# Patient Record
Sex: Male | Born: 1965 | Race: White | Hispanic: No | Marital: Married | Smoking: Former smoker
Health system: Southern US, Community
[De-identification: ages and names within clinical notes are randomized; demographics above are authoritative.]

## PROBLEM LIST (undated history)

## (undated) DIAGNOSIS — J45909 Unspecified asthma, uncomplicated: Secondary | ICD-10-CM

## (undated) DIAGNOSIS — I10 Essential (primary) hypertension: Secondary | ICD-10-CM

## (undated) DIAGNOSIS — G4733 Obstructive sleep apnea (adult) (pediatric): Secondary | ICD-10-CM

## (undated) DIAGNOSIS — F419 Anxiety disorder, unspecified: Secondary | ICD-10-CM

## (undated) DIAGNOSIS — J302 Other seasonal allergic rhinitis: Secondary | ICD-10-CM

## (undated) DIAGNOSIS — F32A Depression, unspecified: Secondary | ICD-10-CM

## (undated) HISTORY — PX: NISSEN FUNDOPLICATION: SHX2091

## (undated) HISTORY — DX: Unspecified asthma, uncomplicated: J45.909

## (undated) HISTORY — PX: APPENDECTOMY: SHX54

---

## 2015-01-07 DIAGNOSIS — I10 Essential (primary) hypertension: Secondary | ICD-10-CM | POA: Insufficient documentation

## 2015-01-07 DIAGNOSIS — R7301 Impaired fasting glucose: Secondary | ICD-10-CM | POA: Insufficient documentation

## 2015-01-07 DIAGNOSIS — K219 Gastro-esophageal reflux disease without esophagitis: Secondary | ICD-10-CM | POA: Insufficient documentation

## 2015-07-06 DIAGNOSIS — M4310 Spondylolisthesis, site unspecified: Secondary | ICD-10-CM | POA: Insufficient documentation

## 2015-11-25 DIAGNOSIS — M51369 Other intervertebral disc degeneration, lumbar region without mention of lumbar back pain or lower extremity pain: Secondary | ICD-10-CM | POA: Insufficient documentation

## 2015-11-25 DIAGNOSIS — M5136 Other intervertebral disc degeneration, lumbar region: Secondary | ICD-10-CM | POA: Insufficient documentation

## 2017-07-24 ENCOUNTER — Ambulatory Visit: Payer: Self-pay | Admitting: Allergy and Immunology

## 2017-08-21 ENCOUNTER — Encounter: Payer: Self-pay | Admitting: Allergy and Immunology

## 2017-08-21 ENCOUNTER — Ambulatory Visit: Payer: BLUE CROSS/BLUE SHIELD | Admitting: Allergy and Immunology

## 2017-08-21 VITALS — BP 112/74 | HR 72 | Temp 97.5°F | Resp 16 | Ht 70.25 in | Wt 207.0 lb

## 2017-08-21 DIAGNOSIS — T7840XD Allergy, unspecified, subsequent encounter: Secondary | ICD-10-CM | POA: Diagnosis not present

## 2017-08-21 DIAGNOSIS — Z91018 Allergy to other foods: Secondary | ICD-10-CM | POA: Insufficient documentation

## 2017-08-21 DIAGNOSIS — H1013 Acute atopic conjunctivitis, bilateral: Secondary | ICD-10-CM

## 2017-08-21 DIAGNOSIS — J3089 Other allergic rhinitis: Secondary | ICD-10-CM

## 2017-08-21 DIAGNOSIS — H1010 Acute atopic conjunctivitis, unspecified eye: Secondary | ICD-10-CM | POA: Insufficient documentation

## 2017-08-21 DIAGNOSIS — T7840XA Allergy, unspecified, initial encounter: Secondary | ICD-10-CM | POA: Insufficient documentation

## 2017-08-21 MED ORDER — CARBINOXAMINE MALEATE 4 MG PO TABS: 4.0000 mg | ORAL_TABLET | ORAL | 1 refills | Status: DC

## 2017-08-21 MED ORDER — MONTELUKAST SODIUM 10 MG PO TABS: 10.0000 mg | ORAL_TABLET | Freq: Every day | ORAL | 5 refills | Status: DC

## 2017-08-21 MED ORDER — FLUTICASONE PROPIONATE 93 MCG/ACT NA EXHU: 2.0000 | INHALANT_SUSPENSION | Freq: Two times a day (BID) | NASAL | 5 refills | Status: DC | PRN

## 2017-08-21 MED ORDER — OLOPATADINE HCL 0.2 % OP SOLN: 1.0000 [drp] | OPHTHALMIC | 5 refills | Status: DC

## 2017-08-21 NOTE — Progress Notes (Signed)
New Patient Note  RE: Timothy Murphy MRN: [id number] DOB: January 22, 1965 Date of Office Visit: 08/21/2017  Referring provider: Clayborn Heron, MD Primary care provider: No primary care provider on file.  Chief Complaint: Allergic Rhinitis ; Conjunctivitis; and Allergic Reaction   History of present illness: Timothy Murphy is a 52 y.o. male seen today in consultation requested by [place], MD.  Over the past 10 years, after having moved from [facility] [place] to [place], Timothy Murphy has experienced nasal congestion, rhinorrhea, thick postnasal drainage, sneezing, nasal pruritus, and sinus pressure.  These symptoms occur year around but are more frequent and severe during the springtime and fall.  The symptoms seem to have progressed over the years.  He has tried fluticasone nasal spray, cetirizine, loratadine, diphenhydramine, and "just about everything" without adequate symptom relief. Timothy Murphy also reports that over the past 15 years he has experienced episodes of severe abdominal cramping with abdominal pain that he describes as "lay down on the floor and hope to die" pain along with diarrhea, and occasionally associated with nausea and vomiting.  On one occasion he was found to have very low blood pressure.  He reports that these symptoms seem to follow the consumption of shellfish.  The onset of symptoms is typically a few hours after consuming shellfish, however there have been times that the symptoms have occurred the next day.  He eliminated shellfish from his diet approximately 8 years ago.  Since eliminating shellfish from his diet he has experienced abdominal pain and diarrhea on rare occasions, however the symptoms are much less severe than they had been previously, therefore he is uncertain if cross-contamination of shellfish protein is to blame.  Assessment and plan: Seasonal and perennial allergic rhinitis  Aeroallergen avoidance measures have been discussed and provided in  written form.  A prescription has been provided for carbinoxamine 4 mg every 6-8 hours if needed.  A prescription has been provided for montelukast 10 mg daily at bedtime.  A prescription has been provided for [facility], 2 actuations per nostril twice a day. Proper technique has been discussed and demonstrated.  Nasal saline spray (i.e., Simply Saline) or nasal saline lavage (i.e., NeilMed) is recommended as needed and prior to medicated nasal sprays.  The risks and benefits of aeroallergen immunotherapy have been discussed. The patient is motivated to initiate immunotherapy if insurance coverage is favorable. He will let us know how he would like to proceed.  Allergic conjunctivitis  Treatment plan as outlined above for allergic rhinitis.  A prescription has been provided for Pazeo, one drop per eye daily as needed.  I have also recommended eye lubricant drops (i.e., Natural Tears) as needed.  History of food allergy The patients history suggests the possibility of food allergy, though todays skin tests were negative despite a positive histamine control.  Food allergen skin testing has excellent negative predictive value however there is still a 5% chance that the allergy exists.  Therefore, we will investigate further with serum specific IgE levels and, if negative, open graded oral challenge.  A laboratory order form has been provided for baseline tryptase level and serum specific IgE against shellfish panel.  Until the food allergy has been definitively ruled out, the patient is to continue meticulous avoidance.   Meds ordered this encounter  Medications  . Carbinoxamine Maleate 4 MG TABS    Sig: Take 1 tablet (4 mg total) by mouth See admin instructions. every 4-6 hours as needed    Dispense:  180 each  Refill:  1  . montelukast (SINGULAIR) 10 MG tablet    Sig: Take 1 tablet (10 mg total) by mouth at bedtime.    Dispense:  30 tablet    Refill:  5  . Fluticasone Propionate  (XHANCE) 93 MCG/ACT EXHU    Sig: Place 2 sprays into both nostrils 2 (two) times daily as needed.    Dispense:  32 mL    Refill:  5    Patient has tried and failed fluticasone, azelastine, ipratropium.  Marland Kitchen. Olopatadine HCl (PATADAY) 0.2 % SOLN    Sig: Place 1 drop into both eyes 1 day or 1 dose.    Dispense:  1 Bottle    Refill:  5    Diagnostics: Epicutaneous testing: Positive to grass pollen, ragweed pollen, weed pollen, tree pollen, and molds. Intradermal testing: Positive to cat hair, dog epithelia, and dust mite antigen. Food allergen skin testing: Negative despite a positive histamine control.    Physical examination: Blood pressure 112/74, pulse 72, temperature (!) 97.5 F (36.4 C), temperature source Oral, resp. rate 16, height 5' 10.25" (1.784 m), weight 207 lb (93.9 kg).  General: Alert, interactive, in no acute distress. HEENT: TMs pearly gray, turbinates edematous with thick discharge, post-pharynx moderately erythematous. Neck: Supple without lymphadenopathy. Lungs: Clear to auscultation without wheezing, rhonchi or rales. CV: Normal S1, S2 without murmurs. Abdomen: Nondistended, nontender. Skin: Warm and dry, without lesions or rashes. Extremities:  No clubbing, cyanosis or edema. Neuro:   Grossly intact.  Review of systems:  Review of systems negative except as noted in HPI / PMHx or noted below: Review of Systems  Constitutional: Negative.   HENT: Negative.   Eyes: Negative.   Respiratory: Negative.   Cardiovascular: Negative.   Gastrointestinal: Negative.   Genitourinary: Negative.   Musculoskeletal: Negative.   Skin: Negative.   Neurological: Negative.   Endo/Heme/Allergies: Negative.   Psychiatric/Behavioral: Negative.     Past medical history:  Past Medical History:  Diagnosis Date  . Asthma    as a child    Past surgical history:  History reviewed. No pertinent surgical history.  Family history: Family History  Problem Relation Age of  Onset  . Allergic rhinitis Neg Hx   . Asthma Neg Hx   . Eczema Neg Hx   . Urticaria Neg Hx   . Angioedema Neg Hx     Social history: Social History   Socioeconomic History  . Marital status: Unknown    Spouse name: Not on file  . Number of children: Not on file  . Years of education: Not on file  . Highest education level: Not on file  Occupational History  . Not on file  Social Needs  . Financial resource strain: Not on file  . Food insecurity:    Worry: Not on file    Inability: Not on file  . Transportation needs:    Medical: Not on file    Non-medical: Not on file  Tobacco Use  . Smoking status: Former Smoker    Last attempt to quit: 2009    Years since quitting: 10.6  . Smokeless tobacco: Never Used  Substance and Sexual Activity  . Alcohol use: Yes    Comment: once a week seasonal  . Drug use: Never  . Sexual activity: Not on file  Lifestyle  . Physical activity:    Days per week: Not on file    Minutes per session: Not on file  . Stress: Not on file  Relationships  .  Social connections:    Talks on phone: Not on file    Gets together: Not on file    Attends religious service: Not on file    Active member of club or organization: Not on file    Attends meetings of clubs or organizations: Not on file    Relationship status: Not on file  . Intimate partner violence:    Fear of current or ex partner: Not on file    Emotionally abused: Not on file    Physically abused: Not on file    Forced sexual activity: Not on file  Other Topics Concern  . Not on file  Social History Narrative  . Not on file   Environmental History: The patient lives in a house built in [place] with hardwood floors throughout, gas heat, and central air.  There are 2 cats in the home which have access to his bedroom.  There is no known mold/water damage in the home.  He is a former cigarette smoker having started in 1986 and quit in 2009.  Allergies as of 08/21/2017      Reactions    Shellfish Allergy    Abdomen pain      Medication List        Accurate as of 08/21/17  6:08 PM. Always use your most recent med list.          amLODipine 5 MG tablet Commonly known as:  NORVASC Take 5 mg by mouth daily.   Carbinoxamine Maleate 4 MG Tabs Take 1 tablet (4 mg total) by mouth See admin instructions. every 4-6 hours as needed   diphenhydrAMINE 25 MG tablet Commonly known as:  BENADRYL Take 25 mg by mouth every 6 (six) hours as needed.   esomeprazole 20 MG capsule Commonly known as:  NEXIUM Take 20 mg by mouth as needed.   Fluticasone Propionate 93 MCG/ACT Exhu Commonly known as:  XHANCE Place 2 sprays into both nostrils 2 (two) times daily as needed.   lisinopril 10 MG tablet Commonly known as:  PRINIVIL,ZESTRIL Take 10 mg by mouth daily.   Melatonin 10 MG Caps Take by mouth.   montelukast 10 MG tablet Commonly known as:  SINGULAIR Take 1 tablet (10 mg total) by mouth at bedtime.   Olopatadine HCl 0.2 % Soln Commonly known as:  PATADAY Place 1 drop into both eyes 1 day or 1 dose.   Omeprazole 20 MG Tbec Take by mouth as needed.       Known medication allergies: Allergies  Allergen Reactions  . Shellfish Allergy     Abdomen pain     I appreciate the opportunity to take part in Timothy Murphy's care. Please do not hesitate to contact me with questions.  Sincerely,   R. Jorene Guest, MD

## 2017-08-21 NOTE — Assessment & Plan Note (Signed)
The patients history suggests the possibility of food allergy, though todays skin tests were negative despite a positive histamine control.  Food allergen skin testing has excellent negative predictive value however there is still a 5% chance that the allergy exists.  Therefore, we will investigate further with serum specific IgE levels and, if negative, open graded oral challenge.  A laboratory order form has been provided for baseline tryptase level and serum specific IgE against shellfish panel.  Until the food allergy has been definitively ruled out, the patient is to continue meticulous avoidance.

## 2017-08-21 NOTE — Patient Instructions (Addendum)
Seasonal and perennial allergic rhinitis  Aeroallergen avoidance measures have been discussed and provided in written form.  A prescription has been provided for carbinoxamine 4 mg every 6-8 hours if needed.  A prescription has been provided for montelukast 10 mg daily at bedtime.  A prescription has been provided for [facility], 2 actuations per nostril twice a day. Proper technique has been discussed and demonstrated.  Nasal saline spray (i.e., Simply Saline) or nasal saline lavage (i.e., NeilMed) is recommended as needed and prior to medicated nasal sprays.  The risks and benefits of aeroallergen immunotherapy have been discussed. The patient is motivated to initiate immunotherapy if insurance coverage is favorable. He will let us know how he would like to proceed.  Allergic conjunctivitis  Treatment plan as outlined above for allergic rhinitis.  A prescription has been provided for Pazeo, one drop per eye daily as needed.  I have also recommended eye lubricant drops (i.e., Natural Tears) as needed.  History of food allergy The patients history suggests the possibility of food allergy, though todays skin tests were negative despite a positive histamine control.  Food allergen skin testing has excellent negative predictive value however there is still a 5% chance that the allergy exists.  Therefore, we will investigate further with serum specific IgE levels and, if negative, open graded oral challenge.  A laboratory order form has been provided for baseline tryptase level and serum specific IgE against shellfish panel.  Until the food allergy has been definitively ruled out, the patient is to continue meticulous avoidance.   Return in about 3 months (around 11/21/2017), or if symptoms worsen or fail to improve.  Reducing Pollen Exposure  The American Academy of Allergy, Asthma and Immunology suggests the following steps to reduce your exposure to pollen during allergy seasons.     1. Do not hang sheets or clothing out to dry; pollen may collect on these items. 2. Do not mow lawns or spend time around freshly cut grass; mowing stirs up pollen. 3. Keep windows closed at night.  Keep car windows closed while driving. 4. Minimize morning activities outdoors, a time when pollen counts are usually at their highest. 5. Stay indoors as much as possible when pollen counts or humidity is high and on windy days when pollen tends to remain in the air longer. 6. Use air conditioning when possible.  Many air conditioners have filters that trap the pollen spores. 7. Use a HEPA room air filter to remove pollen form the indoor air you breathe.   Control of House Dust Mite Allergen  House dust mites play a major role in allergic asthma and rhinitis.  They occur in environments with high humidity wherever human skin, the food for dust mites is found. High levels have been detected in dust obtained from mattresses, pillows, carpets, upholstered furniture, bed covers, clothes and soft toys.  The principal allergen of the house dust mite is found in its feces.  A gram of dust may contain 1,000 mites and 250,000 fecal particles.  Mite antigen is easily measured in the air during house cleaning activities.    1. Encase mattresses, including the box spring, and pillow, in an air tight cover.  Seal the zipper end of the encased mattresses with wide adhesive tape. 2. Wash the bedding in water of 130 degrees Farenheit weekly.  Avoid cotton comforters/quilts and flannel bedding: the most ideal bed covering is the dacron comforter. 3. Remove all upholstered furniture from the bedroom. 4. Remove carpets, carpet padding,  rugs, and non-washable window drapes from the bedroom.  Wash drapes weekly or use plastic window coverings. 5. Remove all non-washable stuffed toys from the bedroom.  Wash stuffed toys weekly. 6. Have the room cleaned frequently with a vacuum cleaner and a damp dust-mop.  The patient  should not be in a room which is being cleaned and should wait 1 hour after cleaning before going into the room. 7. Close and seal all heating outlets in the bedroom.  Otherwise, the room will become filled with dust-laden air.  An electric heater can be used to heat the room. Reduce indoor humidity to less than 50%.  Do not use a humidifier.  Control of Dog or Cat Allergen  Avoidance is the best way to manage a dog or cat allergy. If you have a dog or cat and are allergic to dog or cats, consider removing the dog or cat from the home. If you have a dog or cat but don't want to find it a new home, or if your family wants a pet even though someone in the household is allergic, here are some strategies that may help keep symptoms at bay:  1. Keep the pet out of your bedroom and restrict it to only a few rooms. Be advised that keeping the dog or cat in only one room will not limit the allergens to that room. 2. Don't pet, hug or kiss the dog or cat; if you do, wash your hands with soap and water. 3. High-efficiency particulate air (HEPA) cleaners run continuously in a bedroom or living room can reduce allergen levels over time. 4. Place electrostatic material sheet in the air inlet vent in the bedroom. 5. Regular use of a high-efficiency vacuum cleaner or a central vacuum can reduce allergen levels. 6. Giving your dog or cat a bath at least once a week can reduce airborne allergen.  Control of Mold Allergen  Mold and fungi can grow on a variety of surfaces provided certain temperature and moisture conditions exist.  Outdoor molds grow on plants, decaying vegetation and soil.  The major outdoor mold, Alternaria and Cladosporium, are found in very high numbers during hot and dry conditions.  Generally, a late Summer - Fall peak is seen for common outdoor fungal spores.  Rain will temporarily lower outdoor mold spore count, but counts rise rapidly when the rainy period ends.  The most important indoor  molds are Aspergillus and Penicillium.  Dark, humid and poorly ventilated basements are ideal sites for mold growth.  The next most common sites of mold growth are the bathroom and the kitchen.  Outdoor [employer] 1. Use air conditioning and keep windows closed 2. Avoid exposure to decaying vegetation. 3. Avoid leaf raking. 4. Avoid grain handling. 5. Consider wearing a face mask if working in moldy areas.  Indoor Mold Control 1. Maintain humidity below 50%. 2. Clean washable surfaces with 5% bleach solution. 3. Remove sources e.g. Contaminated carpets.  Control of Cockroach Allergen  Cockroach allergen has been identified as an important cause of acute attacks of asthma, especially in urban settings.  There are fifty-five species of cockroach that exist in the [place], however only three, the [place], [place] species produce allergen that can affect patients with Asthma.  Allergens can be obtained from fecal particles, egg casings and secretions from cockroaches.    1. Remove food sources. 2. Reduce access to water. 3. Seal access and entry points. 4. Spray runways with 0.5-1% Diazinon or  Chlorpyrifos 5. Blow boric acid power under stoves and refrigerator. 6. Place bait stations (hydramethylnon) at feeding sites.

## 2017-08-21 NOTE — Assessment & Plan Note (Signed)
   Aeroallergen avoidance measures have been discussed and provided in written form.  A prescription has been provided for carbinoxamine 4 mg every 6-8 hours if needed.  A prescription has been provided for montelukast 10 mg daily at bedtime.  [facility] 2 actuations per nostril twice a day. Proper technique has been discussed and demonstrated.  Nasal saline spray (i.e., Simply Saline) or nasal saline lavage (i.e., NeilMed) is recommended as needed and prior to medicated nasal sprays.  The risks and benefits of aeroallergen immunotherapy have been discussed. The patient is motivated to initiate immunotherapy if insurance coverage is favorable. He will let us know how he would like to proceed.

## 2017-08-21 NOTE — Assessment & Plan Note (Signed)
   Treatment plan as outlined above for allergic rhinitis.  A prescription has been provided for Pazeo, one drop per eye daily as needed.  I have also recommended eye lubricant drops (i.e., Natural Tears) as needed. 

## 2017-08-22 NOTE — Progress Notes (Signed)
VIALS EXP 08-24-18 

## 2017-08-25 LAB — ALPHA-GAL PANEL
Alpha Gal IgE*: <0.1 kU/L (ref <0.10)
Beef (Bos spp) IgE: <0.1 kU/L (ref <0.35)
Class Interpretation: 0
Class Interpretation: 0
Class Interpretation: 0
Lamb/Mutton (Ovis spp) IgE: <0.1 kU/L (ref <0.35)
Pork (Sus spp) IgE: <0.1 kU/L (ref <0.35)

## 2017-08-25 LAB — TRYPTASE: Tryptase: 3.4 ug/L (ref 2.2–13.2)

## 2017-08-25 LAB — ALLERGEN PROFILE, SHELLFISH
Clam IgE: <0.1 kU/L
F023-IgE Crab: <0.1 kU/L
F080-IgE Lobster: <0.1 kU/L
F290-IgE Oyster: <0.1 kU/L
Scallop IgE: <0.1 kU/L
Shrimp IgE: <0.1 kU/L

## 2017-08-29 DIAGNOSIS — J301 Allergic rhinitis due to pollen: Secondary | ICD-10-CM

## 2017-08-30 DIAGNOSIS — J3089 Other allergic rhinitis: Secondary | ICD-10-CM | POA: Diagnosis not present

## 2017-09-24 ENCOUNTER — Ambulatory Visit (INDEPENDENT_AMBULATORY_CARE_PROVIDER_SITE_OTHER): Payer: BLUE CROSS/BLUE SHIELD | Admitting: *Deleted

## 2017-09-24 DIAGNOSIS — J309 Allergic rhinitis, unspecified: Secondary | ICD-10-CM

## 2017-09-24 DIAGNOSIS — J3089 Other allergic rhinitis: Secondary | ICD-10-CM

## 2017-09-24 MED ORDER — EPINEPHRINE 0.3 MG/0.3ML IJ SOAJ: INTRAMUSCULAR | 1 refills | Status: DC

## 2017-09-24 NOTE — Progress Notes (Signed)
Immunotherapy   Patient Details  Name: Timothy Murphy MRN: [id number] Date of Birth: 09-09-1965  09/24/2017  Roosvelt Harps started injections for  [id number]-[id number] Following schedule: A  Frequency:2 times per week Epi-Pen:Prescription for Epi-Pen given Consent signed and patient instructions given.   Bennye Alm 09/24/2017, 3:33 PM

## 2017-09-28 ENCOUNTER — Ambulatory Visit (INDEPENDENT_AMBULATORY_CARE_PROVIDER_SITE_OTHER): Payer: BLUE CROSS/BLUE SHIELD

## 2017-09-28 DIAGNOSIS — J309 Allergic rhinitis, unspecified: Secondary | ICD-10-CM

## 2017-10-04 ENCOUNTER — Ambulatory Visit (INDEPENDENT_AMBULATORY_CARE_PROVIDER_SITE_OTHER): Payer: BLUE CROSS/BLUE SHIELD | Admitting: *Deleted

## 2017-10-04 DIAGNOSIS — J309 Allergic rhinitis, unspecified: Secondary | ICD-10-CM | POA: Diagnosis not present

## 2017-10-10 ENCOUNTER — Ambulatory Visit (INDEPENDENT_AMBULATORY_CARE_PROVIDER_SITE_OTHER): Payer: BLUE CROSS/BLUE SHIELD

## 2017-10-10 DIAGNOSIS — J309 Allergic rhinitis, unspecified: Secondary | ICD-10-CM | POA: Diagnosis not present

## 2017-10-16 ENCOUNTER — Ambulatory Visit (INDEPENDENT_AMBULATORY_CARE_PROVIDER_SITE_OTHER): Payer: BLUE CROSS/BLUE SHIELD | Admitting: *Deleted

## 2017-10-16 DIAGNOSIS — J309 Allergic rhinitis, unspecified: Secondary | ICD-10-CM | POA: Diagnosis not present

## 2017-10-23 ENCOUNTER — Ambulatory Visit (INDEPENDENT_AMBULATORY_CARE_PROVIDER_SITE_OTHER): Payer: BLUE CROSS/BLUE SHIELD | Admitting: *Deleted

## 2017-10-23 DIAGNOSIS — J309 Allergic rhinitis, unspecified: Secondary | ICD-10-CM | POA: Diagnosis not present

## 2017-10-29 ENCOUNTER — Ambulatory Visit (INDEPENDENT_AMBULATORY_CARE_PROVIDER_SITE_OTHER): Payer: BLUE CROSS/BLUE SHIELD | Admitting: *Deleted

## 2017-10-29 DIAGNOSIS — J309 Allergic rhinitis, unspecified: Secondary | ICD-10-CM | POA: Diagnosis not present

## 2017-11-07 ENCOUNTER — Ambulatory Visit (INDEPENDENT_AMBULATORY_CARE_PROVIDER_SITE_OTHER): Payer: BLUE CROSS/BLUE SHIELD

## 2017-11-07 DIAGNOSIS — J309 Allergic rhinitis, unspecified: Secondary | ICD-10-CM | POA: Diagnosis not present

## 2017-11-14 ENCOUNTER — Ambulatory Visit (INDEPENDENT_AMBULATORY_CARE_PROVIDER_SITE_OTHER): Payer: BLUE CROSS/BLUE SHIELD | Admitting: *Deleted

## 2017-11-14 DIAGNOSIS — J309 Allergic rhinitis, unspecified: Secondary | ICD-10-CM

## 2017-11-19 ENCOUNTER — Encounter: Payer: Self-pay | Admitting: Allergy and Immunology

## 2017-11-19 ENCOUNTER — Ambulatory Visit: Payer: BLUE CROSS/BLUE SHIELD | Admitting: Allergy and Immunology

## 2017-11-19 ENCOUNTER — Ambulatory Visit: Payer: Self-pay

## 2017-11-19 VITALS — BP 110/80 | HR 92 | Resp 16

## 2017-11-19 DIAGNOSIS — H1013 Acute atopic conjunctivitis, bilateral: Secondary | ICD-10-CM

## 2017-11-19 DIAGNOSIS — J3089 Other allergic rhinitis: Secondary | ICD-10-CM

## 2017-11-19 DIAGNOSIS — J309 Allergic rhinitis, unspecified: Secondary | ICD-10-CM

## 2017-11-19 NOTE — Progress Notes (Signed)
Follow-up Note  RE: Timothy Murphy MRN: [id number] DOB: 1965-10-29 Date of Office Visit: 11/19/2017  Primary care provider: Clayborn Heron, MD Referring provider: No ref. provider found  History of present illness: Timothy Murphy is a 52 y.o. male with allergic rhinoconjunctivitis on immunotherapy presenting today for follow-up.  He was previously seen in this clinic for his initial evaluation on August 21, 2017. He reports that he is nasal and ocular allergy symptoms have improved while on the immunotherapy.  He states that he is no longer snoring at night and exposure to his cats no longer provokes symptoms.  He does note that he has experienced some increased nasal congestion with the recent weather change.  He is tolerating aeroallergen immunotherapy buildup injections without problems or complications.  He is taking carbinoxamine occasionally during the day and takes montelukast at bedtime.  He reports that the nasal spray as prescribed have provided no benefit so he has discontinued using these and notes that he has tried multiple nasal sprays in the past without benefit.  Assessment and plan: Seasonal and perennial allergic rhinitis  Continue appropriate aeroallergen avoidance measures, aeroallergen immunotherapy injections as prescribed and as tolerated, carbinoxamine 4 mg every 8 hours if needed, and montelukast 10 mg daily at bedtime.  For thick post nasal drainage, nasal congestion, and/or sinus pressure, add guaifenesin [phone] mg (Mucinex) plus/minus pseudoephedrine 60-120 mg  twice daily as needed with adequate hydration as discussed. Pseudoephedrine is only to be used for short-term relief of nasal/sinus congestion. Long-term use is discouraged due to potential side effects.   Physical examination: Blood pressure 110/80, pulse 92, resp. rate 16.  General: Alert, interactive, in no acute distress. HEENT: TMs pearly gray, turbinates mildly edematous without discharge,  post-pharynx mildly erythematous. Neck: Supple without lymphadenopathy. Lungs: Clear to auscultation without wheezing, rhonchi or rales. CV: Normal S1, S2 without murmurs. Skin: Warm and dry, without lesions or rashes.  The following portions of the patient's history were reviewed and updated as appropriate: allergies, current medications, past family history, past medical history, past social history, past surgical history and problem list.  Allergies as of 11/19/2017      Reactions   Shellfish Allergy    Abdomen pain      Medication List        Accurate as of 11/19/17 11:16 AM. Always use your most recent med list.          amLODipine 5 MG tablet Commonly known as:  NORVASC Take 5 mg by mouth daily.   Carbinoxamine Maleate 4 MG Tabs Take 1 tablet (4 mg total) by mouth See admin instructions. every 4-6 hours as needed   diphenhydrAMINE 25 MG tablet Commonly known as:  BENADRYL Take 25 mg by mouth every 6 (six) hours as needed.   EPINEPHrine 0.3 mg/0.3 mL Soaj injection Commonly known as:  EPI-PEN Use as directed for severe allergic reaction   Fluticasone Propionate 93 MCG/ACT Exhu Place 2 sprays into both nostrils 2 (two) times daily as needed.   lisinopril 10 MG tablet Commonly known as:  PRINIVIL,ZESTRIL Take 10 mg by mouth daily.   Melatonin 10 MG Caps Take by mouth.   montelukast 10 MG tablet Commonly known as:  SINGULAIR Take 1 tablet (10 mg total) by mouth at bedtime.   Olopatadine HCl 0.2 % Soln Place 1 drop into both eyes 1 day or 1 dose.   omeprazole 10 MG capsule Commonly known as:  PRILOSEC Take 10 mg by mouth daily.  Allergies  Allergen Reactions  . Shellfish Allergy     Abdomen pain     I appreciate the opportunity to take part in Timothy Murphy's care. Please do not hesitate to contact me with questions.  Sincerely,   R. Jorene Guest, MD

## 2017-11-19 NOTE — Assessment & Plan Note (Signed)
   Continue appropriate aeroallergen avoidance measures, aeroallergen immunotherapy injections as prescribed and as tolerated, carbinoxamine 4 mg every 8 hours if needed, and montelukast 10 mg daily at bedtime.  For thick post nasal drainage, nasal congestion, and/or sinus pressure, add guaifenesin [phone] mg (Mucinex) plus/minus pseudoephedrine 60-120 mg  twice daily as needed with adequate hydration as discussed. Pseudoephedrine is only to be used for short-term relief of nasal/sinus congestion. Long-term use is discouraged due to potential side effects.

## 2017-11-19 NOTE — Patient Instructions (Addendum)
Seasonal and perennial allergic rhinitis  Continue appropriate aeroallergen avoidance measures, aeroallergen immunotherapy injections as prescribed and as tolerated, carbinoxamine 4 mg every 8 hours if needed, and montelukast 10 mg daily at bedtime.  For thick post nasal drainage, nasal congestion, and/or sinus pressure, add guaifenesin [phone] mg (Mucinex) plus/minus pseudoephedrine 60-120 mg  twice daily as needed with adequate hydration as discussed. Pseudoephedrine is only to be used for short-term relief of nasal/sinus congestion. Long-term use is discouraged due to potential side effects.   Return in about 1 year (around 11/20/2018), or if symptoms worsen or fail to improve.

## 2017-11-26 ENCOUNTER — Ambulatory Visit (INDEPENDENT_AMBULATORY_CARE_PROVIDER_SITE_OTHER): Payer: BLUE CROSS/BLUE SHIELD | Admitting: *Deleted

## 2017-11-26 DIAGNOSIS — J309 Allergic rhinitis, unspecified: Secondary | ICD-10-CM | POA: Diagnosis not present

## 2017-11-27 ENCOUNTER — Ambulatory Visit: Payer: BLUE CROSS/BLUE SHIELD | Admitting: Allergy and Immunology

## 2017-12-07 ENCOUNTER — Ambulatory Visit (INDEPENDENT_AMBULATORY_CARE_PROVIDER_SITE_OTHER): Payer: BLUE CROSS/BLUE SHIELD

## 2017-12-07 DIAGNOSIS — J309 Allergic rhinitis, unspecified: Secondary | ICD-10-CM

## 2017-12-18 ENCOUNTER — Ambulatory Visit (INDEPENDENT_AMBULATORY_CARE_PROVIDER_SITE_OTHER): Payer: BLUE CROSS/BLUE SHIELD | Admitting: *Deleted

## 2017-12-18 DIAGNOSIS — J309 Allergic rhinitis, unspecified: Secondary | ICD-10-CM | POA: Diagnosis not present

## 2017-12-25 ENCOUNTER — Ambulatory Visit (INDEPENDENT_AMBULATORY_CARE_PROVIDER_SITE_OTHER): Payer: BLUE CROSS/BLUE SHIELD | Admitting: *Deleted

## 2017-12-25 DIAGNOSIS — J309 Allergic rhinitis, unspecified: Secondary | ICD-10-CM

## 2018-01-14 ENCOUNTER — Ambulatory Visit (INDEPENDENT_AMBULATORY_CARE_PROVIDER_SITE_OTHER): Payer: BLUE CROSS/BLUE SHIELD | Admitting: *Deleted

## 2018-01-14 DIAGNOSIS — J309 Allergic rhinitis, unspecified: Secondary | ICD-10-CM | POA: Diagnosis not present

## 2018-01-21 ENCOUNTER — Ambulatory Visit (INDEPENDENT_AMBULATORY_CARE_PROVIDER_SITE_OTHER): Payer: BLUE CROSS/BLUE SHIELD

## 2018-01-21 DIAGNOSIS — J309 Allergic rhinitis, unspecified: Secondary | ICD-10-CM

## 2018-01-28 ENCOUNTER — Ambulatory Visit (INDEPENDENT_AMBULATORY_CARE_PROVIDER_SITE_OTHER): Payer: BLUE CROSS/BLUE SHIELD

## 2018-01-28 DIAGNOSIS — J309 Allergic rhinitis, unspecified: Secondary | ICD-10-CM

## 2018-02-05 ENCOUNTER — Ambulatory Visit (INDEPENDENT_AMBULATORY_CARE_PROVIDER_SITE_OTHER): Payer: BLUE CROSS/BLUE SHIELD | Admitting: *Deleted

## 2018-02-05 DIAGNOSIS — J309 Allergic rhinitis, unspecified: Secondary | ICD-10-CM

## 2018-02-10 ENCOUNTER — Other Ambulatory Visit: Payer: Self-pay | Admitting: Allergy and Immunology

## 2018-02-14 ENCOUNTER — Ambulatory Visit (INDEPENDENT_AMBULATORY_CARE_PROVIDER_SITE_OTHER): Payer: BLUE CROSS/BLUE SHIELD | Admitting: *Deleted

## 2018-02-14 DIAGNOSIS — J309 Allergic rhinitis, unspecified: Secondary | ICD-10-CM

## 2018-02-20 ENCOUNTER — Ambulatory Visit (INDEPENDENT_AMBULATORY_CARE_PROVIDER_SITE_OTHER): Payer: BLUE CROSS/BLUE SHIELD

## 2018-02-20 DIAGNOSIS — J309 Allergic rhinitis, unspecified: Secondary | ICD-10-CM | POA: Diagnosis not present

## 2018-02-26 ENCOUNTER — Ambulatory Visit (INDEPENDENT_AMBULATORY_CARE_PROVIDER_SITE_OTHER): Payer: BLUE CROSS/BLUE SHIELD | Admitting: *Deleted

## 2018-02-26 DIAGNOSIS — J309 Allergic rhinitis, unspecified: Secondary | ICD-10-CM | POA: Diagnosis not present

## 2018-03-05 ENCOUNTER — Ambulatory Visit (INDEPENDENT_AMBULATORY_CARE_PROVIDER_SITE_OTHER): Payer: BLUE CROSS/BLUE SHIELD | Admitting: *Deleted

## 2018-03-05 DIAGNOSIS — J309 Allergic rhinitis, unspecified: Secondary | ICD-10-CM | POA: Diagnosis not present

## 2018-03-25 ENCOUNTER — Ambulatory Visit (INDEPENDENT_AMBULATORY_CARE_PROVIDER_SITE_OTHER): Payer: BLUE CROSS/BLUE SHIELD

## 2018-03-25 DIAGNOSIS — J309 Allergic rhinitis, unspecified: Secondary | ICD-10-CM | POA: Diagnosis not present

## 2018-03-27 ENCOUNTER — Ambulatory Visit (INDEPENDENT_AMBULATORY_CARE_PROVIDER_SITE_OTHER): Payer: BLUE CROSS/BLUE SHIELD | Admitting: *Deleted

## 2018-03-27 DIAGNOSIS — J309 Allergic rhinitis, unspecified: Secondary | ICD-10-CM | POA: Diagnosis not present

## 2018-04-02 ENCOUNTER — Other Ambulatory Visit: Payer: Self-pay

## 2018-04-02 ENCOUNTER — Ambulatory Visit (INDEPENDENT_AMBULATORY_CARE_PROVIDER_SITE_OTHER): Payer: BLUE CROSS/BLUE SHIELD | Admitting: *Deleted

## 2018-04-02 ENCOUNTER — Other Ambulatory Visit: Payer: Self-pay | Admitting: Allergy and Immunology

## 2018-04-02 DIAGNOSIS — J309 Allergic rhinitis, unspecified: Secondary | ICD-10-CM

## 2018-04-02 MED ORDER — CARBINOXAMINE MALEATE 4 MG PO TABS: 4.0000 mg | ORAL_TABLET | ORAL | 5 refills | Status: DC

## 2018-04-02 NOTE — Telephone Encounter (Signed)
Patient needs refill on carbinoxamine called into [facility] on [address]

## 2018-04-02 NOTE — Telephone Encounter (Signed)
Refill sent.

## 2018-04-05 ENCOUNTER — Ambulatory Visit (INDEPENDENT_AMBULATORY_CARE_PROVIDER_SITE_OTHER): Payer: BLUE CROSS/BLUE SHIELD | Admitting: *Deleted

## 2018-04-05 DIAGNOSIS — J309 Allergic rhinitis, unspecified: Secondary | ICD-10-CM | POA: Diagnosis not present

## 2018-04-12 ENCOUNTER — Ambulatory Visit (INDEPENDENT_AMBULATORY_CARE_PROVIDER_SITE_OTHER): Payer: BLUE CROSS/BLUE SHIELD | Admitting: *Deleted

## 2018-04-12 DIAGNOSIS — J309 Allergic rhinitis, unspecified: Secondary | ICD-10-CM | POA: Diagnosis not present

## 2018-04-16 ENCOUNTER — Ambulatory Visit (INDEPENDENT_AMBULATORY_CARE_PROVIDER_SITE_OTHER): Payer: BLUE CROSS/BLUE SHIELD | Admitting: *Deleted

## 2018-04-16 DIAGNOSIS — J309 Allergic rhinitis, unspecified: Secondary | ICD-10-CM | POA: Diagnosis not present

## 2018-04-25 ENCOUNTER — Ambulatory Visit (INDEPENDENT_AMBULATORY_CARE_PROVIDER_SITE_OTHER): Payer: BLUE CROSS/BLUE SHIELD

## 2018-04-25 DIAGNOSIS — J309 Allergic rhinitis, unspecified: Secondary | ICD-10-CM | POA: Diagnosis not present

## 2018-05-01 ENCOUNTER — Ambulatory Visit (INDEPENDENT_AMBULATORY_CARE_PROVIDER_SITE_OTHER): Payer: BLUE CROSS/BLUE SHIELD

## 2018-05-01 DIAGNOSIS — J309 Allergic rhinitis, unspecified: Secondary | ICD-10-CM

## 2018-05-07 ENCOUNTER — Ambulatory Visit (INDEPENDENT_AMBULATORY_CARE_PROVIDER_SITE_OTHER): Payer: BLUE CROSS/BLUE SHIELD | Admitting: *Deleted

## 2018-05-07 DIAGNOSIS — J309 Allergic rhinitis, unspecified: Secondary | ICD-10-CM | POA: Diagnosis not present

## 2018-05-20 ENCOUNTER — Ambulatory Visit (INDEPENDENT_AMBULATORY_CARE_PROVIDER_SITE_OTHER): Payer: BLUE CROSS/BLUE SHIELD

## 2018-05-20 DIAGNOSIS — J309 Allergic rhinitis, unspecified: Secondary | ICD-10-CM

## 2018-05-30 ENCOUNTER — Ambulatory Visit (INDEPENDENT_AMBULATORY_CARE_PROVIDER_SITE_OTHER): Payer: BLUE CROSS/BLUE SHIELD

## 2018-05-30 DIAGNOSIS — J309 Allergic rhinitis, unspecified: Secondary | ICD-10-CM

## 2018-06-05 ENCOUNTER — Ambulatory Visit (INDEPENDENT_AMBULATORY_CARE_PROVIDER_SITE_OTHER): Payer: BLUE CROSS/BLUE SHIELD | Admitting: *Deleted

## 2018-06-05 DIAGNOSIS — J309 Allergic rhinitis, unspecified: Secondary | ICD-10-CM

## 2018-06-18 ENCOUNTER — Ambulatory Visit (INDEPENDENT_AMBULATORY_CARE_PROVIDER_SITE_OTHER): Payer: BLUE CROSS/BLUE SHIELD | Admitting: *Deleted

## 2018-06-18 DIAGNOSIS — J309 Allergic rhinitis, unspecified: Secondary | ICD-10-CM | POA: Diagnosis not present

## 2018-07-01 ENCOUNTER — Ambulatory Visit (INDEPENDENT_AMBULATORY_CARE_PROVIDER_SITE_OTHER): Payer: BLUE CROSS/BLUE SHIELD | Admitting: *Deleted

## 2018-07-01 DIAGNOSIS — J309 Allergic rhinitis, unspecified: Secondary | ICD-10-CM

## 2018-07-09 ENCOUNTER — Ambulatory Visit (INDEPENDENT_AMBULATORY_CARE_PROVIDER_SITE_OTHER): Payer: BLUE CROSS/BLUE SHIELD | Admitting: *Deleted

## 2018-07-09 DIAGNOSIS — J309 Allergic rhinitis, unspecified: Secondary | ICD-10-CM | POA: Diagnosis not present

## 2018-07-16 ENCOUNTER — Ambulatory Visit (INDEPENDENT_AMBULATORY_CARE_PROVIDER_SITE_OTHER): Payer: BLUE CROSS/BLUE SHIELD | Admitting: *Deleted

## 2018-07-16 DIAGNOSIS — J309 Allergic rhinitis, unspecified: Secondary | ICD-10-CM

## 2018-07-23 ENCOUNTER — Ambulatory Visit (INDEPENDENT_AMBULATORY_CARE_PROVIDER_SITE_OTHER): Payer: BLUE CROSS/BLUE SHIELD | Admitting: *Deleted

## 2018-07-23 DIAGNOSIS — J309 Allergic rhinitis, unspecified: Secondary | ICD-10-CM | POA: Diagnosis not present

## 2018-07-24 DIAGNOSIS — J301 Allergic rhinitis due to pollen: Secondary | ICD-10-CM | POA: Diagnosis not present

## 2018-07-24 NOTE — Progress Notes (Signed)
VIALS EXP 07-24-2019 

## 2018-08-01 ENCOUNTER — Ambulatory Visit (INDEPENDENT_AMBULATORY_CARE_PROVIDER_SITE_OTHER): Payer: BC Managed Care – PPO | Admitting: *Deleted

## 2018-08-01 DIAGNOSIS — J309 Allergic rhinitis, unspecified: Secondary | ICD-10-CM

## 2018-08-05 ENCOUNTER — Ambulatory Visit (INDEPENDENT_AMBULATORY_CARE_PROVIDER_SITE_OTHER): Payer: BC Managed Care – PPO | Admitting: *Deleted

## 2018-08-05 DIAGNOSIS — J309 Allergic rhinitis, unspecified: Secondary | ICD-10-CM | POA: Diagnosis not present

## 2018-08-12 ENCOUNTER — Other Ambulatory Visit: Payer: Self-pay | Admitting: Allergy and Immunology

## 2018-08-15 ENCOUNTER — Ambulatory Visit (INDEPENDENT_AMBULATORY_CARE_PROVIDER_SITE_OTHER): Payer: BC Managed Care – PPO

## 2018-08-15 DIAGNOSIS — J309 Allergic rhinitis, unspecified: Secondary | ICD-10-CM

## 2018-08-21 ENCOUNTER — Ambulatory Visit (INDEPENDENT_AMBULATORY_CARE_PROVIDER_SITE_OTHER): Payer: BC Managed Care – PPO | Admitting: *Deleted

## 2018-08-21 DIAGNOSIS — J309 Allergic rhinitis, unspecified: Secondary | ICD-10-CM | POA: Diagnosis not present

## 2018-08-30 ENCOUNTER — Ambulatory Visit (INDEPENDENT_AMBULATORY_CARE_PROVIDER_SITE_OTHER): Payer: BC Managed Care – PPO | Admitting: *Deleted

## 2018-08-30 DIAGNOSIS — J309 Allergic rhinitis, unspecified: Secondary | ICD-10-CM

## 2018-09-06 ENCOUNTER — Ambulatory Visit (INDEPENDENT_AMBULATORY_CARE_PROVIDER_SITE_OTHER): Payer: BC Managed Care – PPO

## 2018-09-06 DIAGNOSIS — J309 Allergic rhinitis, unspecified: Secondary | ICD-10-CM

## 2018-09-09 ENCOUNTER — Ambulatory Visit (INDEPENDENT_AMBULATORY_CARE_PROVIDER_SITE_OTHER): Payer: BC Managed Care – PPO | Admitting: *Deleted

## 2018-09-09 DIAGNOSIS — J309 Allergic rhinitis, unspecified: Secondary | ICD-10-CM

## 2018-09-19 ENCOUNTER — Ambulatory Visit: Payer: BC Managed Care – PPO | Admitting: Podiatry

## 2018-09-19 ENCOUNTER — Ambulatory Visit (INDEPENDENT_AMBULATORY_CARE_PROVIDER_SITE_OTHER): Payer: BC Managed Care – PPO

## 2018-09-19 ENCOUNTER — Other Ambulatory Visit: Payer: Self-pay

## 2018-09-19 ENCOUNTER — Encounter: Payer: Self-pay | Admitting: Podiatry

## 2018-09-19 VITALS — BP 121/85 | HR 80 | Resp 16

## 2018-09-19 DIAGNOSIS — M722 Plantar fascial fibromatosis: Secondary | ICD-10-CM

## 2018-09-19 MED ORDER — DICLOFENAC SODIUM 75 MG PO TBEC: 75.0000 mg | DELAYED_RELEASE_TABLET | Freq: Two times a day (BID) | ORAL | 2 refills | Status: DC

## 2018-09-19 NOTE — Patient Instructions (Signed)

## 2018-09-19 NOTE — Progress Notes (Signed)
Subjective:   Patient ID: Timothy Murphy, male   DOB: 53 y.o.   MRN: [id number]   HPI Patient presents stating he is developed intense discomfort in the right plantar heel that has been going on for around 2 months and has had a history of back pain is not sure this may not be contributory.  Patient does not smoke likes to be active   Review of Systems  All other systems reviewed and are negative.       Objective:  Physical Exam Vitals signs and nursing note reviewed.  Constitutional:      Appearance: He is well-developed.  Pulmonary:     Effort: Pulmonary effort is normal.  Musculoskeletal: Normal range of motion.  Skin:    General: Skin is warm.  Neurological:     Mental Status: He is alert.     Neurovascular status intact muscle strength adequate range of motion within normal limits with patient found to have exquisite discomfort plantar aspect right heel at the insertional point of the tendon into the calcaneus with inflammation fluid around the medial band.  Patient has moderate depression of the arch and is noted to have good digital perfusion and is well oriented x3     Assessment:  Acute plantar fasciitis right with inflammation fluid medial band     Plan:  H&P condition reviewed and today I did sterile prep and injected the fascia at its insertion 3 mg Kenalog 5 mg Xylocaine and apply fascial brace with instructions and gave prescription for diclofenac 75 mg twice daily and exercises to strengthen the fascia along with shoe gear modifications  X-rays indicate that there is small spur no indications of stress fracture or arthritis

## 2018-09-19 NOTE — Progress Notes (Signed)
   Subjective:    Patient ID: Timothy Murphy, male    DOB: 1965/01/24, 53 y.o.   MRN: [id number]  HPI    Review of Systems  All other systems reviewed and are negative.      Objective:   Physical Exam        Assessment & Plan:

## 2018-09-20 ENCOUNTER — Ambulatory Visit (INDEPENDENT_AMBULATORY_CARE_PROVIDER_SITE_OTHER): Payer: BC Managed Care – PPO | Admitting: *Deleted

## 2018-09-20 DIAGNOSIS — J309 Allergic rhinitis, unspecified: Secondary | ICD-10-CM

## 2018-10-01 ENCOUNTER — Ambulatory Visit (INDEPENDENT_AMBULATORY_CARE_PROVIDER_SITE_OTHER): Payer: BC Managed Care – PPO | Admitting: *Deleted

## 2018-10-01 DIAGNOSIS — J309 Allergic rhinitis, unspecified: Secondary | ICD-10-CM | POA: Diagnosis not present

## 2018-10-03 ENCOUNTER — Ambulatory Visit (INDEPENDENT_AMBULATORY_CARE_PROVIDER_SITE_OTHER): Payer: BC Managed Care – PPO | Admitting: Podiatry

## 2018-10-03 ENCOUNTER — Encounter: Payer: Self-pay | Admitting: Podiatry

## 2018-10-03 ENCOUNTER — Other Ambulatory Visit: Payer: Self-pay

## 2018-10-03 DIAGNOSIS — M722 Plantar fascial fibromatosis: Secondary | ICD-10-CM

## 2018-10-07 ENCOUNTER — Ambulatory Visit (INDEPENDENT_AMBULATORY_CARE_PROVIDER_SITE_OTHER): Payer: BC Managed Care – PPO

## 2018-10-07 DIAGNOSIS — J309 Allergic rhinitis, unspecified: Secondary | ICD-10-CM

## 2018-10-07 NOTE — Progress Notes (Signed)
Subjective:   Patient ID: Timothy Murphy, male   DOB: 53 y.o.   MRN: [id number]   HPI Patient presents stating that the tendon is still bothering him to a degree but improved but worse when he is on cement surfaces   ROS      Objective:  Physical Exam  Neurovascular status intact with patient found to have continued discomfort in the right fascia at the insertional point tendon into the calcaneus with inflammation fluid buildup     Assessment:  Acute plantar fasciitis right with inflammation fluid buildup     Plan:  Patient appeared condition reviewed his today I went ahead discussed continued physical therapy did sterile prep we injected the fascia 3 mg Kenalog 5 mg Xylocaine and dispensed a power step type insole to provide for foot support and reappoint on an as-needed basis

## 2018-10-14 ENCOUNTER — Ambulatory Visit (INDEPENDENT_AMBULATORY_CARE_PROVIDER_SITE_OTHER): Payer: BC Managed Care – PPO

## 2018-10-14 DIAGNOSIS — J309 Allergic rhinitis, unspecified: Secondary | ICD-10-CM

## 2018-10-24 ENCOUNTER — Ambulatory Visit (INDEPENDENT_AMBULATORY_CARE_PROVIDER_SITE_OTHER): Payer: BC Managed Care – PPO | Admitting: *Deleted

## 2018-10-24 DIAGNOSIS — J309 Allergic rhinitis, unspecified: Secondary | ICD-10-CM

## 2018-10-28 ENCOUNTER — Ambulatory Visit (INDEPENDENT_AMBULATORY_CARE_PROVIDER_SITE_OTHER): Payer: BC Managed Care – PPO

## 2018-10-28 DIAGNOSIS — J309 Allergic rhinitis, unspecified: Secondary | ICD-10-CM | POA: Diagnosis not present

## 2018-10-31 ENCOUNTER — Ambulatory Visit: Payer: BC Managed Care – PPO | Admitting: Podiatry

## 2018-11-04 ENCOUNTER — Ambulatory Visit (INDEPENDENT_AMBULATORY_CARE_PROVIDER_SITE_OTHER): Payer: BC Managed Care – PPO

## 2018-11-04 DIAGNOSIS — J309 Allergic rhinitis, unspecified: Secondary | ICD-10-CM | POA: Diagnosis not present

## 2018-11-06 NOTE — Progress Notes (Signed)
Vials exp 11-06-19 

## 2018-11-07 DIAGNOSIS — J301 Allergic rhinitis due to pollen: Secondary | ICD-10-CM | POA: Diagnosis not present

## 2018-11-13 ENCOUNTER — Ambulatory Visit (INDEPENDENT_AMBULATORY_CARE_PROVIDER_SITE_OTHER): Payer: BC Managed Care – PPO

## 2018-11-13 DIAGNOSIS — J309 Allergic rhinitis, unspecified: Secondary | ICD-10-CM | POA: Diagnosis not present

## 2018-11-18 ENCOUNTER — Ambulatory Visit: Payer: BLUE CROSS/BLUE SHIELD | Admitting: Allergy and Immunology

## 2018-11-21 ENCOUNTER — Other Ambulatory Visit: Payer: Self-pay

## 2018-11-21 DIAGNOSIS — Z20822 Contact with and (suspected) exposure to covid-19: Secondary | ICD-10-CM

## 2018-11-22 LAB — NOVEL CORONAVIRUS, NAA: SARS-CoV-2, NAA: NOT DETECTED

## 2018-11-26 ENCOUNTER — Other Ambulatory Visit: Payer: Self-pay | Admitting: Podiatry

## 2018-12-02 ENCOUNTER — Encounter: Payer: Self-pay | Admitting: Allergy and Immunology

## 2018-12-02 ENCOUNTER — Ambulatory Visit: Payer: BC Managed Care – PPO | Admitting: Allergy and Immunology

## 2018-12-02 ENCOUNTER — Other Ambulatory Visit: Payer: Self-pay

## 2018-12-02 VITALS — BP 126/72 | HR 69 | Temp 98.1°F

## 2018-12-02 DIAGNOSIS — J309 Allergic rhinitis, unspecified: Secondary | ICD-10-CM

## 2018-12-02 DIAGNOSIS — J3089 Other allergic rhinitis: Secondary | ICD-10-CM | POA: Diagnosis not present

## 2018-12-02 MED ORDER — EPINEPHRINE 0.3 MG/0.3ML IJ SOAJ: INTRAMUSCULAR | 1 refills | Status: AC

## 2018-12-02 MED ORDER — AZELASTINE HCL 0.15 % NA SOLN: 1.0000 | Freq: Two times a day (BID) | NASAL | 5 refills | Status: DC | PRN

## 2018-12-02 NOTE — Patient Instructions (Addendum)
Seasonal and perennial allergic rhinitis  Continue appropriate aeroallergen avoidance measures, aeroallergen immunotherapy injections as prescribed and as tolerated,   Continue Xhance, 2 sprays per nostril twice daily.  A prescription has been provided for azelastine nasal spray, 1-2 sprays per nostril 2 times daily as needed.   Nasal saline spray (i.e., Simply Saline) or nasal saline lavage (i.e., NeilMed) is recommended as needed and prior to medicated nasal sprays.  Carbinoxamine 4 mg every 8 hours if needed.  For thick post nasal drainage, nasal congestion, and/or sinus pressure, add guaifenesin [phone] mg (Mucinex) plus/minus pseudoephedrine 60-120 mg  twice daily as needed with adequate hydration as discussed. Pseudoephedrine is only to be used for short-term relief of nasal/sinus congestion. Long-term use is discouraged due to potential side effects.   Return in about 1 year (around 12/02/2019), or if symptoms worsen or fail to improve.

## 2018-12-02 NOTE — Progress Notes (Signed)
Follow-up Note  RE: Timothy Murphy MRN: [id number] DOB: 06/12/1965 Date of Office Visit: 12/02/2018  Primary care provider: Clayborn Heron, MD Referring provider: Clayborn Heron, MD  History of present illness: Timothy Murphy is a 53 y.o. male with allergic rhinitis presenting today for follow-up.  He was last seen in this clinic in November 2019.  He has been receiving aero allergen immunotherapy injections without problems or complications.  He states that his persistent nasal congestion is "better than it was", however he still experiences persistent nasal congestion despite compliance with Xhance nasal spray.  He reports that the [place] brings about relief for 1 to 2 hours but then he experiences congestion again.  Assessment and plan: Seasonal and perennial allergic rhinitis  Continue appropriate aeroallergen avoidance measures, aeroallergen immunotherapy injections as prescribed and as tolerated,   Continue Xhance, 2 sprays per nostril twice daily.  A prescription has been provided for azelastine nasal spray, 1-2 sprays per nostril 2 times daily as needed.   Nasal saline spray (i.e., Simply Saline) or nasal saline lavage (i.e., NeilMed) is recommended as needed and prior to medicated nasal sprays.  Carbinoxamine 4 mg every 8 hours if needed.  For thick post nasal drainage, nasal congestion, and/or sinus pressure, add guaifenesin [phone] mg (Mucinex) plus/minus pseudoephedrine 60-120 mg  twice daily as needed with adequate hydration as discussed. Pseudoephedrine is only to be used for short-term relief of nasal/sinus congestion. Long-term use is discouraged due to potential side effects.   Meds ordered this encounter  Medications  . EPINEPHrine (AUVI-Q) 0.3 mg/0.3 mL IJ SOAJ injection    Sig: Use as directed for severe allergic reaction    Dispense:  2 each    Refill:  1    [phone]  . Azelastine HCl 0.15 % SOLN    Sig: Place 1-2 sprays into both nostrils 2  (two) times daily as needed.    Dispense:  30 mL    Refill:  5    Physical examination: Blood pressure 126/72, pulse 69, temperature 98.1 F (36.7 C), temperature source Temporal, SpO2 97 %.  General: Alert, interactive, in no acute distress. HEENT: TMs pearly gray, turbinates moderately edematous without discharge, post-pharynx mildly erythematous. Neck: Supple without lymphadenopathy. Lungs: Clear to auscultation without wheezing, rhonchi or rales. CV: Normal S1, S2 without murmurs. Skin: Warm and dry, without lesions or rashes.  The following portions of the patient's history were reviewed and updated as appropriate: allergies, current medications, past family history, past medical history, past social history, past surgical history and problem list.  Current Outpatient Medications  Medication Sig Dispense Refill  . diclofenac (VOLTAREN) 75 MG EC tablet TAKE 1 TABLET BY MOUTH TWICE A DAY 50 tablet 2  . diphenhydrAMINE (BENADRYL) 25 MG tablet Take 25 mg by mouth every 6 (six) hours as needed.    Marland Kitchen EPINEPHrine (AUVI-Q) 0.3 mg/0.3 mL IJ SOAJ injection Use as directed for severe allergic reaction 2 each 1  . Fluticasone Propionate (XHANCE) 93 MCG/ACT EXHU Place 2 sprays into both nostrils 2 (two) times daily as needed. 32 mL 5  . lisinopril (PRINIVIL,ZESTRIL) 10 MG tablet Take 10 mg by mouth daily.  3  . Melatonin 10 MG CAPS Take by mouth.    . montelukast (SINGULAIR) 10 MG tablet TAKE 1 TABLET BY MOUTH EVERYDAY AT BEDTIME 30 tablet 5  . omeprazole (PRILOSEC) 10 MG capsule Take 10 mg by mouth daily.    . sertraline (ZOLOFT) 50 MG tablet Take 100 mg by  mouth daily.     . Azelastine HCl 0.15 % SOLN Place 1-2 sprays into both nostrils 2 (two) times daily as needed. 30 mL 5   No current facility-administered medications for this visit.     Allergies  Allergen Reactions  . Shellfish Allergy     Abdomen pain     I appreciate the opportunity to take part in Schneider's care. Please do not  hesitate to contact me with questions.  Sincerely,   R. Edgar Frisk, MD

## 2018-12-02 NOTE — Assessment & Plan Note (Signed)
   Continue appropriate aeroallergen avoidance measures, aeroallergen immunotherapy injections as prescribed and as tolerated,   Continue Xhance, 2 sprays per nostril twice daily.  A prescription has been provided for azelastine nasal spray, 1-2 sprays per nostril 2 times daily as needed.   Nasal saline spray (i.e., Simply Saline) or nasal saline lavage (i.e., NeilMed) is recommended as needed and prior to medicated nasal sprays.  Carbinoxamine 4 mg every 8 hours if needed.  For thick post nasal drainage, nasal congestion, and/or sinus pressure, add guaifenesin [phone] mg (Mucinex) plus/minus pseudoephedrine 60-120 mg  twice daily as needed with adequate hydration as discussed. Pseudoephedrine is only to be used for short-term relief of nasal/sinus congestion. Long-term use is discouraged due to potential side effects.

## 2018-12-17 ENCOUNTER — Ambulatory Visit (INDEPENDENT_AMBULATORY_CARE_PROVIDER_SITE_OTHER): Payer: BC Managed Care – PPO | Admitting: *Deleted

## 2018-12-17 DIAGNOSIS — J309 Allergic rhinitis, unspecified: Secondary | ICD-10-CM

## 2018-12-24 ENCOUNTER — Ambulatory Visit (INDEPENDENT_AMBULATORY_CARE_PROVIDER_SITE_OTHER): Payer: BC Managed Care – PPO

## 2018-12-24 DIAGNOSIS — J309 Allergic rhinitis, unspecified: Secondary | ICD-10-CM

## 2019-01-01 ENCOUNTER — Ambulatory Visit (INDEPENDENT_AMBULATORY_CARE_PROVIDER_SITE_OTHER): Payer: BC Managed Care – PPO

## 2019-01-01 DIAGNOSIS — J309 Allergic rhinitis, unspecified: Secondary | ICD-10-CM | POA: Diagnosis not present

## 2019-01-20 ENCOUNTER — Ambulatory Visit (INDEPENDENT_AMBULATORY_CARE_PROVIDER_SITE_OTHER): Payer: BC Managed Care – PPO

## 2019-01-20 DIAGNOSIS — J309 Allergic rhinitis, unspecified: Secondary | ICD-10-CM

## 2019-02-04 ENCOUNTER — Ambulatory Visit (INDEPENDENT_AMBULATORY_CARE_PROVIDER_SITE_OTHER): Payer: BC Managed Care – PPO

## 2019-02-04 DIAGNOSIS — J309 Allergic rhinitis, unspecified: Secondary | ICD-10-CM

## 2019-02-10 ENCOUNTER — Other Ambulatory Visit: Payer: Self-pay | Admitting: Podiatry

## 2019-02-11 ENCOUNTER — Ambulatory Visit (INDEPENDENT_AMBULATORY_CARE_PROVIDER_SITE_OTHER): Payer: BC Managed Care – PPO

## 2019-02-11 DIAGNOSIS — J309 Allergic rhinitis, unspecified: Secondary | ICD-10-CM

## 2019-02-17 ENCOUNTER — Ambulatory Visit (INDEPENDENT_AMBULATORY_CARE_PROVIDER_SITE_OTHER): Payer: BC Managed Care – PPO

## 2019-02-17 DIAGNOSIS — J309 Allergic rhinitis, unspecified: Secondary | ICD-10-CM | POA: Diagnosis not present

## 2019-03-04 ENCOUNTER — Other Ambulatory Visit: Payer: Self-pay

## 2019-03-04 ENCOUNTER — Ambulatory Visit (INDEPENDENT_AMBULATORY_CARE_PROVIDER_SITE_OTHER): Payer: BC Managed Care – PPO

## 2019-03-04 DIAGNOSIS — J309 Allergic rhinitis, unspecified: Secondary | ICD-10-CM

## 2019-03-04 MED ORDER — MONTELUKAST SODIUM 10 MG PO TABS: ORAL_TABLET | ORAL | 5 refills | Status: DC

## 2019-03-10 ENCOUNTER — Ambulatory Visit (INDEPENDENT_AMBULATORY_CARE_PROVIDER_SITE_OTHER): Payer: BC Managed Care – PPO

## 2019-03-10 DIAGNOSIS — J309 Allergic rhinitis, unspecified: Secondary | ICD-10-CM | POA: Diagnosis not present

## 2019-03-25 ENCOUNTER — Ambulatory Visit (INDEPENDENT_AMBULATORY_CARE_PROVIDER_SITE_OTHER): Payer: BC Managed Care – PPO

## 2019-03-25 DIAGNOSIS — J309 Allergic rhinitis, unspecified: Secondary | ICD-10-CM | POA: Diagnosis not present

## 2019-03-28 ENCOUNTER — Ambulatory Visit: Payer: Self-pay | Attending: Internal Medicine

## 2019-03-28 DIAGNOSIS — Z23 Encounter for immunization: Secondary | ICD-10-CM

## 2019-03-28 NOTE — Progress Notes (Signed)
   Covid-19 Vaccination Clinic  Name:  Timothy Murphy    MRN: [id number] DOB: 06/16/1965  03/28/2019  [name] was observed post Covid-19 immunization for 15 minutes without incident. He was provided with Vaccine Information Sheet and instruction to access the V-Safe system.   [name] was instructed to call 911 with any severe reactions post vaccine: Marland Kitchen Difficulty breathing  . Swelling of face and throat  . A fast heartbeat  . A bad rash all over body  . Dizziness and weakness   Immunizations Administered    Name Date Dose VIS Date Route   Pfizer COVID-19 Vaccine 03/28/2019 10:27 AM 0.3 mL 12/27/2018 Intramuscular   Manufacturer: [employer], [employer]   Lot: [id number]   NDC: [address]-7

## 2019-04-16 ENCOUNTER — Ambulatory Visit (INDEPENDENT_AMBULATORY_CARE_PROVIDER_SITE_OTHER): Payer: BC Managed Care – PPO

## 2019-04-16 DIAGNOSIS — J309 Allergic rhinitis, unspecified: Secondary | ICD-10-CM

## 2019-04-21 ENCOUNTER — Ambulatory Visit: Payer: Self-pay | Attending: Internal Medicine

## 2019-04-21 DIAGNOSIS — Z23 Encounter for immunization: Secondary | ICD-10-CM

## 2019-04-21 NOTE — Progress Notes (Signed)
   Covid-19 Vaccination Clinic  Name:  Loye Vento    MRN: [id number] DOB: 10/19/1965  04/21/2019  Mr. Makarewicz was observed post Covid-19 immunization for 15 minutes without incident. He was provided with Vaccine Information Sheet and instruction to access the V-Safe system.   Mr. Sproule was instructed to call 911 with any severe reactions post vaccine: Marland Kitchen Difficulty breathing  . Swelling of face and throat  . A fast heartbeat  . A bad rash all over body  . Dizziness and weakness   Immunizations Administered    Name Date Dose VIS Date Route   Pfizer COVID-19 Vaccine 04/21/2019  2:19 PM 0.3 mL 12/27/2018 Intramuscular   Manufacturer: [employer], [employer]   Lot: [id number]   NDC: [address]-5

## 2019-04-28 ENCOUNTER — Ambulatory Visit (INDEPENDENT_AMBULATORY_CARE_PROVIDER_SITE_OTHER): Payer: BC Managed Care – PPO

## 2019-04-28 DIAGNOSIS — J309 Allergic rhinitis, unspecified: Secondary | ICD-10-CM | POA: Diagnosis not present

## 2019-04-29 DIAGNOSIS — J301 Allergic rhinitis due to pollen: Secondary | ICD-10-CM

## 2019-04-29 NOTE — Progress Notes (Signed)
Vials exp 04-28-20

## 2019-05-12 ENCOUNTER — Ambulatory Visit (INDEPENDENT_AMBULATORY_CARE_PROVIDER_SITE_OTHER): Payer: BC Managed Care – PPO

## 2019-05-12 DIAGNOSIS — J309 Allergic rhinitis, unspecified: Secondary | ICD-10-CM

## 2019-05-28 ENCOUNTER — Other Ambulatory Visit: Payer: Self-pay | Admitting: Allergy and Immunology

## 2019-05-28 ENCOUNTER — Ambulatory Visit (INDEPENDENT_AMBULATORY_CARE_PROVIDER_SITE_OTHER): Payer: BC Managed Care – PPO

## 2019-05-28 DIAGNOSIS — J309 Allergic rhinitis, unspecified: Secondary | ICD-10-CM | POA: Diagnosis not present

## 2019-06-04 ENCOUNTER — Ambulatory Visit (INDEPENDENT_AMBULATORY_CARE_PROVIDER_SITE_OTHER): Payer: BC Managed Care – PPO

## 2019-06-04 DIAGNOSIS — J309 Allergic rhinitis, unspecified: Secondary | ICD-10-CM | POA: Diagnosis not present

## 2019-06-13 ENCOUNTER — Ambulatory Visit (INDEPENDENT_AMBULATORY_CARE_PROVIDER_SITE_OTHER): Payer: BC Managed Care – PPO

## 2019-06-13 DIAGNOSIS — J309 Allergic rhinitis, unspecified: Secondary | ICD-10-CM

## 2019-07-04 ENCOUNTER — Ambulatory Visit (INDEPENDENT_AMBULATORY_CARE_PROVIDER_SITE_OTHER): Payer: BC Managed Care – PPO

## 2019-07-04 DIAGNOSIS — J309 Allergic rhinitis, unspecified: Secondary | ICD-10-CM | POA: Diagnosis not present

## 2019-08-05 ENCOUNTER — Ambulatory Visit
Admission: RE | Admit: 2019-08-05 | Discharge: 2019-08-05 | Disposition: A | Discharge status: Home / Self Care | Payer: BC Managed Care – PPO | Source: Ambulatory Visit | Attending: Family Medicine | Admitting: Family Medicine

## 2019-08-05 ENCOUNTER — Other Ambulatory Visit: Payer: Self-pay | Admitting: Family Medicine

## 2019-08-05 DIAGNOSIS — R053 Chronic cough: Secondary | ICD-10-CM

## 2019-08-09 ENCOUNTER — Inpatient Hospital Stay (HOSPITAL_COMMUNITY)
Admission: EM | Admit: 2019-08-09 | Discharge: 2019-08-12 | DRG: 203 | Disposition: A | Discharge status: Home / Self Care | Payer: BC Managed Care – PPO | Attending: Internal Medicine | Admitting: Internal Medicine

## 2019-08-09 ENCOUNTER — Other Ambulatory Visit: Payer: Self-pay

## 2019-08-09 DIAGNOSIS — R0603 Acute respiratory distress: Secondary | ICD-10-CM | POA: Diagnosis present

## 2019-08-09 DIAGNOSIS — F419 Anxiety disorder, unspecified: Secondary | ICD-10-CM | POA: Diagnosis present

## 2019-08-09 DIAGNOSIS — J42 Unspecified chronic bronchitis: Secondary | ICD-10-CM | POA: Diagnosis present

## 2019-08-09 DIAGNOSIS — R0609 Other forms of dyspnea: Secondary | ICD-10-CM

## 2019-08-09 DIAGNOSIS — Z79899 Other long term (current) drug therapy: Secondary | ICD-10-CM

## 2019-08-09 DIAGNOSIS — F329 Major depressive disorder, single episode, unspecified: Secondary | ICD-10-CM | POA: Diagnosis present

## 2019-08-09 DIAGNOSIS — R911 Solitary pulmonary nodule: Secondary | ICD-10-CM | POA: Diagnosis present

## 2019-08-09 DIAGNOSIS — J40 Bronchitis, not specified as acute or chronic: Secondary | ICD-10-CM | POA: Diagnosis present

## 2019-08-09 DIAGNOSIS — J209 Acute bronchitis, unspecified: Principal | ICD-10-CM | POA: Diagnosis present

## 2019-08-09 DIAGNOSIS — R0600 Dyspnea, unspecified: Secondary | ICD-10-CM | POA: Diagnosis not present

## 2019-08-09 DIAGNOSIS — R079 Chest pain, unspecified: Secondary | ICD-10-CM

## 2019-08-09 DIAGNOSIS — Z6831 Body mass index (BMI) 31.0-31.9, adult: Secondary | ICD-10-CM

## 2019-08-09 DIAGNOSIS — Z87891 Personal history of nicotine dependence: Secondary | ICD-10-CM

## 2019-08-09 DIAGNOSIS — Z20822 Contact with and (suspected) exposure to covid-19: Secondary | ICD-10-CM | POA: Diagnosis present

## 2019-08-09 DIAGNOSIS — E669 Obesity, unspecified: Secondary | ICD-10-CM | POA: Diagnosis present

## 2019-08-09 DIAGNOSIS — I10 Essential (primary) hypertension: Secondary | ICD-10-CM | POA: Diagnosis present

## 2019-08-09 DIAGNOSIS — G4733 Obstructive sleep apnea (adult) (pediatric): Secondary | ICD-10-CM | POA: Diagnosis present

## 2019-08-09 DIAGNOSIS — F32A Depression, unspecified: Secondary | ICD-10-CM | POA: Diagnosis present

## 2019-08-09 HISTORY — DX: Depression, unspecified: F32.A

## 2019-08-09 HISTORY — DX: Essential (primary) hypertension: I10

## 2019-08-09 HISTORY — DX: Obstructive sleep apnea (adult) (pediatric): G47.33

## 2019-08-09 HISTORY — DX: Anxiety disorder, unspecified: F41.9

## 2019-08-09 HISTORY — DX: Other seasonal allergic rhinitis: J30.2

## 2019-08-10 ENCOUNTER — Emergency Department (HOSPITAL_COMMUNITY): Payer: BC Managed Care – PPO

## 2019-08-10 ENCOUNTER — Other Ambulatory Visit: Payer: Self-pay

## 2019-08-10 ENCOUNTER — Encounter (HOSPITAL_COMMUNITY): Payer: Self-pay | Admitting: Emergency Medicine

## 2019-08-10 DIAGNOSIS — G4733 Obstructive sleep apnea (adult) (pediatric): Secondary | ICD-10-CM | POA: Diagnosis present

## 2019-08-10 DIAGNOSIS — I10 Essential (primary) hypertension: Secondary | ICD-10-CM | POA: Diagnosis present

## 2019-08-10 DIAGNOSIS — R0600 Dyspnea, unspecified: Secondary | ICD-10-CM | POA: Diagnosis present

## 2019-08-10 DIAGNOSIS — J42 Unspecified chronic bronchitis: Secondary | ICD-10-CM | POA: Diagnosis present

## 2019-08-10 DIAGNOSIS — R0602 Shortness of breath: Secondary | ICD-10-CM | POA: Diagnosis not present

## 2019-08-10 DIAGNOSIS — F32A Depression, unspecified: Secondary | ICD-10-CM | POA: Diagnosis present

## 2019-08-10 DIAGNOSIS — F419 Anxiety disorder, unspecified: Secondary | ICD-10-CM | POA: Diagnosis present

## 2019-08-10 DIAGNOSIS — Z20822 Contact with and (suspected) exposure to covid-19: Secondary | ICD-10-CM | POA: Diagnosis present

## 2019-08-10 DIAGNOSIS — Z87891 Personal history of nicotine dependence: Secondary | ICD-10-CM | POA: Diagnosis not present

## 2019-08-10 DIAGNOSIS — Z6831 Body mass index (BMI) 31.0-31.9, adult: Secondary | ICD-10-CM | POA: Diagnosis not present

## 2019-08-10 DIAGNOSIS — F329 Major depressive disorder, single episode, unspecified: Secondary | ICD-10-CM | POA: Diagnosis present

## 2019-08-10 DIAGNOSIS — E669 Obesity, unspecified: Secondary | ICD-10-CM | POA: Diagnosis present

## 2019-08-10 DIAGNOSIS — R0609 Other forms of dyspnea: Secondary | ICD-10-CM | POA: Diagnosis present

## 2019-08-10 DIAGNOSIS — R0603 Acute respiratory distress: Secondary | ICD-10-CM | POA: Diagnosis present

## 2019-08-10 DIAGNOSIS — J209 Acute bronchitis, unspecified: Secondary | ICD-10-CM | POA: Diagnosis present

## 2019-08-10 DIAGNOSIS — J40 Bronchitis, not specified as acute or chronic: Secondary | ICD-10-CM | POA: Diagnosis not present

## 2019-08-10 DIAGNOSIS — I159 Secondary hypertension, unspecified: Secondary | ICD-10-CM

## 2019-08-10 DIAGNOSIS — R911 Solitary pulmonary nodule: Secondary | ICD-10-CM | POA: Diagnosis present

## 2019-08-10 DIAGNOSIS — Z9989 Dependence on other enabling machines and devices: Secondary | ICD-10-CM

## 2019-08-10 DIAGNOSIS — Z79899 Other long term (current) drug therapy: Secondary | ICD-10-CM | POA: Diagnosis not present

## 2019-08-10 LAB — TROPONIN I (HIGH SENSITIVITY)
Troponin I (High Sensitivity): 4 ng/L (ref <18)
Troponin I (High Sensitivity): 3 ng/L (ref <18)

## 2019-08-10 LAB — CBC
HCT: 46.8 % (ref 39.0–52.0)
Hemoglobin: 15 g/dL (ref 13.0–17.0)
MCH: 28.1 pg (ref 26.0–34.0)
MCHC: 32.1 g/dL (ref 30.0–36.0)
MCV: 87.6 fL (ref 80.0–100.0)
Platelets: 288 10*3/uL (ref 150–400)
RBC: 5.34 MIL/uL (ref 4.22–5.81)
RDW: 14.9 % (ref 11.5–15.5)
WBC: 11.4 10*3/uL — ABNORMAL HIGH (ref 4.0–10.5)
nRBC: 0 % (ref 0.0–0.2)

## 2019-08-10 LAB — TSH: TSH: 1.891 u[IU]/mL (ref 0.350–4.500)

## 2019-08-10 LAB — HEPATIC FUNCTION PANEL
ALT: 41 U/L (ref 0–44)
AST: 33 U/L (ref 15–41)
Albumin: 4 g/dL (ref 3.5–5.0)
Alkaline Phosphatase: 129 U/L — ABNORMAL HIGH (ref 38–126)
Bilirubin, Direct: 0.2 mg/dL (ref 0.0–0.2)
Indirect Bilirubin: 0.7 mg/dL (ref 0.3–0.9)
Total Bilirubin: 0.9 mg/dL (ref 0.3–1.2)
Total Protein: 7.2 g/dL (ref 6.5–8.1)

## 2019-08-10 LAB — BASIC METABOLIC PANEL
Anion gap: 8 (ref 5–15)
BUN: 12 mg/dL (ref 6–20)
CO2: 26 mmol/L (ref 22–32)
Calcium: 9.5 mg/dL (ref 8.9–10.3)
Chloride: 102 mmol/L (ref 98–111)
Creatinine, Ser: 1.19 mg/dL (ref 0.61–1.24)
GFR calc Af Amer: >60 mL/min (ref >60)
GFR calc non Af Amer: >60 mL/min (ref >60)
Glucose, Bld: 140 mg/dL — ABNORMAL HIGH (ref 70–99)
Potassium: 4.2 mmol/L (ref 3.5–5.1)
Sodium: 136 mmol/L (ref 135–145)

## 2019-08-10 LAB — RAPID URINE DRUG SCREEN, HOSP PERFORMED
Amphetamines: NOT DETECTED
Barbiturates: NOT DETECTED
Benzodiazepines: NOT DETECTED
Cocaine: NOT DETECTED
Opiates: NOT DETECTED
Tetrahydrocannabinol: POSITIVE — AB

## 2019-08-10 LAB — SARS CORONAVIRUS 2 BY RT PCR (HOSPITAL ORDER, PERFORMED IN ~~LOC~~ HOSPITAL LAB): SARS Coronavirus 2: NEGATIVE

## 2019-08-10 LAB — HIV ANTIBODY (ROUTINE TESTING W REFLEX): HIV Screen 4th Generation wRfx: NONREACTIVE

## 2019-08-10 MED ORDER — ALBUTEROL SULFATE (2.5 MG/3ML) 0.083% IN NEBU
5.0000 mg | INHALATION_SOLUTION | Freq: Once | RESPIRATORY_TRACT | Status: AC
  Administered 2019-08-10: 5 mg via RESPIRATORY_TRACT
  Filled 2019-08-10: qty 6

## 2019-08-10 MED ORDER — ONDANSETRON HCL 4 MG/2ML IJ SOLN: 4.0000 mg | Freq: Four times a day (QID) | INTRAMUSCULAR | Status: DC | PRN

## 2019-08-10 MED ORDER — GUAIFENESIN ER 600 MG PO TB12
600.0000 mg | ORAL_TABLET | Freq: Two times a day (BID) | ORAL | Status: DC
  Administered 2019-08-10 – 2019-08-12 (×4): 600 mg via ORAL
  Filled 2019-08-10 (×4): qty 1

## 2019-08-10 MED ORDER — ALBUTEROL SULFATE (2.5 MG/3ML) 0.083% IN NEBU: 2.5000 mg | INHALATION_SOLUTION | Freq: Four times a day (QID) | RESPIRATORY_TRACT | Status: DC | PRN

## 2019-08-10 MED ORDER — ACETAMINOPHEN 325 MG PO TABS: 650.0000 mg | ORAL_TABLET | Freq: Four times a day (QID) | ORAL | Status: DC | PRN

## 2019-08-10 MED ORDER — SERTRALINE HCL 50 MG PO TABS
50.0000 mg | ORAL_TABLET | Freq: Every day | ORAL | Status: DC
  Administered 2019-08-10 – 2019-08-12 (×3): 50 mg via ORAL
  Filled 2019-08-10 (×4): qty 1

## 2019-08-10 MED ORDER — ENOXAPARIN SODIUM 40 MG/0.4ML ~~LOC~~ SOLN
40.0000 mg | SUBCUTANEOUS | Status: DC
  Administered 2019-08-10 – 2019-08-11 (×2): 40 mg via SUBCUTANEOUS
  Filled 2019-08-10 (×2): qty 0.4

## 2019-08-10 MED ORDER — SODIUM CHLORIDE 0.9% FLUSH
3.0000 mL | Freq: Two times a day (BID) | INTRAVENOUS | Status: DC
  Administered 2019-08-10 – 2019-08-11 (×2): 3 mL via INTRAVENOUS

## 2019-08-10 MED ORDER — MELATONIN 3 MG PO TABS
3.0000 mg | ORAL_TABLET | Freq: Every evening | ORAL | Status: DC | PRN
  Administered 2019-08-10 – 2019-08-11 (×2): 3 mg via ORAL
  Filled 2019-08-10 (×2): qty 1

## 2019-08-10 MED ORDER — MONTELUKAST SODIUM 10 MG PO TABS: 10.0000 mg | ORAL_TABLET | Freq: Every evening | ORAL | Status: DC | PRN

## 2019-08-10 MED ORDER — ACETAMINOPHEN 650 MG RE SUPP: 650.0000 mg | Freq: Four times a day (QID) | RECTAL | Status: DC | PRN

## 2019-08-10 MED ORDER — ONDANSETRON HCL 4 MG PO TABS: 4.0000 mg | ORAL_TABLET | Freq: Four times a day (QID) | ORAL | Status: DC | PRN

## 2019-08-10 MED ORDER — CLONAZEPAM 0.25 MG PO TBDP
0.2500 mg | ORAL_TABLET | Freq: Two times a day (BID) | ORAL | Status: DC | PRN
  Administered 2019-08-10 – 2019-08-11 (×2): 0.25 mg via ORAL
  Filled 2019-08-10 (×2): qty 1

## 2019-08-10 MED ORDER — CLONAZEPAM 0.5 MG PO TABS: 0.2500 mg | ORAL_TABLET | Freq: Two times a day (BID) | ORAL | Status: DC | PRN

## 2019-08-10 MED ORDER — IOHEXOL 350 MG/ML SOLN
52.0000 mL | Freq: Once | INTRAVENOUS | Status: AC | PRN
  Administered 2019-08-10: 52 mL via INTRAVENOUS

## 2019-08-10 NOTE — H&P (Addendum)
History and Physical    Timothy Murphy MAU:[id number] DOB: [birth year] DOA: 08/09/2019  Referring MD/NP/PA: Frederick Peers, MD PCP: Clayborn Heron, MD  Patient coming from: Home  Chief Complaint: Progressively worsening cough  I have personally briefly reviewed patient's old medical records in [facility]   HPI: Laurel Smeltz is a 54 y.o. male with medical history significant of hypertension, anxiety, depression, OSA on CPAP, and remote history of tobacco abuse presents with complaints of progressively worsening cough and shortness of breath.  Patient reports over the last 6 weeks he has had a nonproductive cough that worsens with any kind of exertion.  With activity he reports that he starts to cough, gets significantly short of breath, diaphoretic, nauseous, and has centralized chest tightness.  Patient has been evaluated both times.  Initially given a course of Augmentin, then course of prednisone with albuterol inhaler, and then a second course of prednisone without any improvement in symptoms.  Patient reports that he actually thinks they are getting worse.  He denies having any significant fever, vomiting, diarrhea, weight loss, or recent sick contacts.  He reports that he quit smoking approximately 10 years ago with smoked less than half a pack of cigarettes daily for approximately 15 to 20 years.  He has not been formally evaluated by pulmonology.  Reports that he has been on immunotherapy for history of allergies and no longer takes Singulair.   ED Course: Upon admission into the emergency department patient was seen to be afebrile, heart rate 82-110, blood pressures elevated up to 140/98, and all other vital signs maintained.  Labs significant for WBC 11.4 and troponins negative x2.  EKG did not show any significant signs of infection.  A CT angiogram of the chest was performed revealing diffuse bilateral bronchial wall thickening consistent with infectious or inflammatory  bronchiolitis, and a subpleural 5 mm nodule in the right lower lung field,  and no signs of a pulmonary embolism.  Patient was given albuterol breathing treatment.  Review of Systems  Constitutional: Positive for diaphoresis and malaise/fatigue. Negative for fever and weight loss.  HENT: Negative for ear discharge and nosebleeds.   Eyes: Negative for double vision and pain.  Respiratory: Positive for cough, shortness of breath and wheezing. Negative for sputum production.   Cardiovascular: Positive for chest pain. Negative for leg swelling.  Gastrointestinal: Positive for nausea. Negative for vomiting.  Genitourinary: Negative for dysuria and hematuria.  Musculoskeletal: Negative for joint pain and myalgias.  Skin: Negative for itching.  Neurological: Negative for focal weakness, loss of consciousness and headaches.  Endo/Heme/Allergies: Positive for environmental allergies.  Psychiatric/Behavioral: Negative for substance abuse. The patient is nervous/anxious.     Past Medical History:  Diagnosis Date  . Anxiety   . Depression   . Hypertension   . OSA (obstructive sleep apnea)   . OSA on CPAP   . Seasonal allergies     History reviewed. No pertinent surgical history.   reports that he quit smoking about 11 years ago. He does not have any smokeless tobacco history on file. No history on file for alcohol use and drug use.  No Known Allergies  Family History  Problem Relation Age of Onset  . Cancer Maternal Grandfather        Blood cancer    Prior to Admission medications   Medication Sig Start Date End Date Taking? Authorizing Provider  albuterol (VENTOLIN HFA) 108 (90 Base) MCG/ACT inhaler Inhale 1-2 puffs into the lungs every 6 (six) hours  as needed for wheezing or shortness of breath.   Yes [provider]  Bioflavonoid Products (VITAMIN C) CHEW Chew 1 tablet by mouth daily.   Yes [provider]  clonazePAM (KLONOPIN) 0.5 MG tablet Take 0.25-0.5 mg by  mouth 2 (two) times daily as needed for anxiety.   Yes [provider]  lisinopril (ZESTRIL) 5 MG tablet Take 5 mg by mouth daily.   Yes [provider]  MELATONIN PO Take 1 tablet by mouth at bedtime as needed (sleep).   Yes [provider]  montelukast (SINGULAIR) 10 MG tablet Take 10 mg by mouth at bedtime as needed (allergies).   Yes [provider]  Multiple Vitamin (MULTIVITAMIN PO) Take 1 tablet by mouth daily.   Yes [provider]  Multiple Vitamins-Minerals (ZINC PO) Take 1 tablet by mouth daily.   Yes [provider]  sertraline (ZOLOFT) 50 MG tablet Take 50 mg by mouth daily.   Yes [provider]    Physical Exam:  Constitutional: Obese middle-aged male who appears in no acute distress intermittently cough Vitals:   08/10/19 0916 08/10/19 1029 08/10/19 1117 08/10/19 1520  BP: (!) 138/99  (!) 133/92 (!) 137/97  Pulse: 81 85 87 80  Resp: 18 15 21 18   Temp: 98.1 F (36.7 C)     TempSrc: Oral     SpO2: 100% 99% 97% 100%  Weight:      Height:       Eyes: PERRL, lids and conjunctivae normal ENMT: Mucous membranes are moist. Posterior pharynx clear of any exudate or lesions.   Neck: normal, supple, no masses, no thyromegaly Respiratory: No significant wheezes or rhonchi appreciated on physical exam.  O2 saturations currently maintained. Cardiovascular: Regular rate and rhythm, no murmurs / rubs / gallops. No extremity edema. 2+ pedal pulses. No carotid bruits.  Abdomen: no tenderness, no masses palpated. No hepatosplenomegaly. Bowel sounds positive.  Musculoskeletal: no clubbing / cyanosis. No joint deformity upper and lower extremities. Good ROM, no contractures. Normal muscle tone.  Skin: no rashes, lesions, ulcers. No induration Neurologic: CN 2-12 grossly intact. Sensation intact, DTR normal. Strength 5/5 in all 4.  Psychiatric: Normal judgment and insight. Alert and oriented x 3. Normal mood.     Labs on  Admission: I have personally reviewed following labs and imaging studies  CBC: Recent Labs  Lab 08/10/19 0008  WBC 11.4*  HGB 15.0  HCT 46.8  MCV 87.6  PLT 288   Basic Metabolic Panel: Recent Labs  Lab 08/10/19 0008  NA 136  K 4.2  CL 102  CO2 26  GLUCOSE 140*  BUN 12  CREATININE 1.19  CALCIUM 9.5   GFR: Estimated Creatinine Clearance: 85.1 mL/min (by C-G formula based on SCr of 1.19 mg/dL). Liver Function Tests: Recent Labs  Lab 08/10/19 1031  AST 33  ALT 41  ALKPHOS 129*  BILITOT 0.9  PROT 7.2  ALBUMIN 4.0   No results for input(s): LIPASE, AMYLASE in the last 168 hours. No results for input(s): AMMONIA in the last 168 hours. Coagulation Profile: No results for input(s): INR, PROTIME in the last 168 hours. Cardiac Enzymes: No results for input(s): CKTOTAL, CKMB, CKMBINDEX, TROPONINI in the last 168 hours. BNP (last 3 results) No results for input(s): PROBNP in the last 8760 hours. HbA1C: No results for input(s): HGBA1C in the last 72 hours. CBG: No results for input(s): GLUCAP in the last 168 hours. Lipid Profile: No results for input(s): CHOL, HDL, LDLCALC, TRIG,  CHOLHDL, LDLDIRECT in the last 72 hours. Thyroid Function Tests: No results for input(s): TSH, T4TOTAL, FREET4, T3FREE, THYROIDAB in the last 72 hours. Anemia Panel: No results for input(s): VITAMINB12, FOLATE, FERRITIN, TIBC, IRON, RETICCTPCT in the last 72 hours. Urine analysis: No results found for: COLORURINE, APPEARANCEUR, LABSPEC, PHURINE, GLUCOSEU, HGBUR, BILIRUBINUR, KETONESUR, PROTEINUR, UROBILINOGEN, NITRITE, LEUKOCYTESUR Sepsis Labs: Recent Results (from the past 240 hour(s))  SARS Coronavirus 2 by RT PCR (hospital order, performed in [facility] hospital lab) Nasopharyngeal Nasopharyngeal Swab     Status: None   Collection Time: 08/10/19 11:14 AM   Specimen: Nasopharyngeal Swab  Result Value Ref Range Status   SARS Coronavirus 2 NEGATIVE NEGATIVE Final    Comment:  (NOTE) SARS-CoV-2 target nucleic acids are NOT DETECTED.  The SARS-CoV-2 RNA is generally detectable in upper and lower respiratory specimens during the acute phase of infection. The lowest concentration of SARS-CoV-2 viral copies this assay can detect is 250 copies / mL. A negative result does not preclude SARS-CoV-2 infection and should not be used as the sole basis for treatment or other patient management decisions.  A negative result may occur with improper specimen collection / handling, submission of specimen other than nasopharyngeal swab, presence of viral mutation(s) within the areas targeted by this assay, and inadequate number of viral copies (<250 copies / mL). A negative result must be combined with clinical observations, patient history, and epidemiological information.  Fact Sheet for Patients:   BoilerBrush.com.cy  Fact Sheet for Healthcare Providers: https://pope.com/  This test is not yet approved or  cleared by the [place] FDA and has been authorized for detection and/or diagnosis of SARS-CoV-2 by FDA under an Emergency Use Authorization (EUA).  This EUA will remain in effect (meaning this test can be used) for the duration of the COVID-19 declaration under Section 564(b)(1) of the Act, 21 U.S.C. section 360bbb-3(b)(1), unless the authorization is terminated or revoked sooner.  Performed at [facility], [address] [place], [place] [address]      Radiological Exams on Admission: DG Chest 2 View  Result Date: 08/10/2019 CLINICAL DATA:  Shortness of breath EXAM: CHEST - 2 VIEW COMPARISON:  None. FINDINGS: The heart size and mediastinal contours are within normal limits. Both lungs are clear. The visualized skeletal structures are unremarkable. IMPRESSION: No active cardiopulmonary disease. Electronically Signed   By: Katherine Mantle M.D.   On: 08/10/2019 00:33   CT Angio Chest PE W/Cm &/Or Wo  Cm  Result Date: 08/10/2019 CLINICAL DATA:  Chest pain, shortness of breath EXAM: CT ANGIOGRAPHY CHEST WITH CONTRAST TECHNIQUE: Multidetector CT imaging of the chest was performed using the standard protocol during bolus administration of intravenous contrast. Multiplanar CT image reconstructions and MIPs were obtained to evaluate the vascular anatomy. CONTRAST:  69mL OMNIPAQUE IOHEXOL 350 MG/ML SOLN COMPARISON:  None. FINDINGS: Cardiovascular: Satisfactory opacification of the pulmonary arteries to the segmental level. No evidence of pulmonary embolism. Normal heart size. No pericardial effusion. Mediastinum/Nodes: No enlarged mediastinal, hilar, or axillary lymph nodes. Thyroid gland, trachea, and esophagus demonstrate no significant findings. Lungs/Pleura: Mild, diffuse bilateral bronchial wall thickening. Bandlike scarring and or atelectasis of the left lower lobe and lingula. 5 mm subpleural nodule of the dependent right lower lobe (series 6, image 68). No pleural effusion or pneumothorax. Upper Abdomen: No acute abnormality.  Hepatic steatosis. Musculoskeletal: No chest wall abnormality. No acute or significant osseous findings. Review of the MIP images confirms the above findings. IMPRESSION: 1. Negative examination for pulmonary  embolism. 2. Mild, diffuse bilateral bronchial wall thickening, consistent with nonspecific infectious or inflammatory bronchitis. 3. There is a 5 mm subpleural nodule of the dependent right lower lobe. No follow-up needed if patient is low-risk. Non-contrast chest CT can be considered in 12 months if patient is high-risk. This recommendation follows the consensus statement: Guidelines for Management of Incidental Pulmonary Nodules Detected on CT Images: From the Fleischner Society 2017; Radiology 2017; 284:228-243. 4. Hepatic steatosis. Electronically Signed   By: Lauralyn PrimesAlex  Bibbey M.D.   On: 08/10/2019 11:18    EKG: Independently reviewed. Sinus tachycardia at 110  bpm  Assessment/Plan Dyspnea on exertion: Patient presents with complaints of a nonproductive cough and shortness of breath over the last 6 weeks.  Initial troponins negative x2 and EKG without any significant ischemic changes.  No clear signs of blood clot or cardiomegaly.  Family would like formal evaluation by cardiology to see if cardiac stress testing is warranted. -Admit to a medical telmeetry bed -Check TSH -Check echocardiogram -Message sent for cardiology to formally evaluate for possible need of stress testing  Leukocytosis: WBC was mildly elevated at 11.4.  No clear signs of infection appreciated at this time. -Recheck CBC in a.m.  Bronchitis: Acute on chronic.  He reports that he was previously treated with a course of Augmentin, steroids, albuterol inhaler, and second course of steroids without improvement in symptoms.  CT angiogram of the chest showed diffuse bronchial thickening concerning for bronchitis.  Suspect that this likely could be the source of patient's symptoms. -Check respiratory virus panel -Mucinex -Albuterol nebs as needed -Continue supportive care at this time -May warrant referral to pulmonology for further evaluation  Essential hypertension: Home blood pressure medications include lisinopril 5 mg daily. -Continue lisinopril  Pulmonary nodule: Patient was noted to have a 5 mm subpleural nodule on the dependent right lower lung field. -Consider repeat noncontrasted CT of the chest in 12 months for follow-up  Depression and anxiety -Continue Zoloft and clonazepam as needed for anxiety  OSA on CPAP -CPAP per RT  Obesity: BMI 30.13 kg/m  DVT prophylaxis: Lovenox Code Status: Full Family Communication: Discussed plan of care with wife present at bedside Disposition Plan: Likely discharge home if work-up negative Consults called: Message sent for cardiology to eval for stress testing Admission status: observation  Clydie Braunondell A Laterra Lubinski MD [facility] Pager [phone]   If 7PM-7AM, please contact night-coverage [email or web address] Password [email or web address]  08/10/2019, 5:31 PM

## 2019-08-10 NOTE — Consult Note (Addendum)
Cardiology Consultation:   Patient ID: Timothy Murphy MRN: [id number]; DOB: 03/20/1965  Admit date: 08/09/2019 Date of Consult: 08/10/2019  Primary Care Provider: Clayborn Heron, MD Primary Cardiologist: None (NEW) Primary Electrophysiologist:  None    Patient Profile:   Timothy Murphy is a 54 y.o. male with a hx of HTN, Anxiety/depression, OSA on CPAP and remote tobacco use who is being seen today for the evaluation of SOB and cough at the request of Madelyn Flavors, MD.  History of Present Illness:   Mr. Timothy Murphy is a 53 y.o. male with medical history significant of hypertension, anxiety, depression, OSA on CPAP, and remote history of tobacco abuse presented to the ER with complaints of progressively worsening cough and shortness of breath.  Patient reports over the last 6 weeks he has had a nonproductive cough that worsens with any kind of exertion.  With activity he reports that he starts to cough, gets significantly short of breath, diaphoretic, nauseous, and has centralized chest tightness.  Patient has been evaluated both times.  Initially given a course of Augmentin, then course of prednisone with albuterol inhaler, and then a second course of prednisone without any improvement in symptoms.  Patient reports that he actually thinks they are getting worse.  He denies having any significant fever, vomiting, diarrhea, weight loss, or recent sick contacts.  He reports that he quit smoking approximately 10 years ago with smoked less than half a pack of cigarettes daily for approximately 15 to 20 years.  He has not been formally evaluated by pulmonology.  Reports that he has been on immunotherapy for history of allergies and no longer takes Singulair.  Evaluation in the ER showed BP 140/72mmHg, hsTrop neg x 2 and normal EKG.  Chest CTA negative for PE.but did show diffuse bilateral bronchial wall thickening c/w infectious or inflammatory bronchiotiis.  The family has requested a cardiology consult  to determine if sx could be cardiac related.  He denies any PND, orthopnea, LE edema, dizziness, palpitations or syncope. He has a remote hx of tobacco use but no fm hx of cardiac disease. 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  His symptoms are much worse on the days that he works.  His sx start out as a cough and then he feels SOB and breaks out in a sweat and then will have chest discomfort that is heaviness and is much worse when he coughs and thinks is related to constant coughing.  The pain is nonexertional with no radiation.     Past Medical History:  Diagnosis Date  . Anxiety   . Depression   . Hypertension   . OSA (obstructive sleep apnea)   . OSA on CPAP   . Seasonal allergies     History reviewed. No pertinent surgical history.   Home Medications:  Prior to Admission medications   Medication Sig Start Date End Date Taking? Authorizing Provider  albuterol (VENTOLIN HFA) 108 (90 Base) MCG/ACT inhaler Inhale 1-2 puffs into the lungs every 6 (six) hours as needed for wheezing or shortness of breath.   Yes [provider]  Bioflavonoid Products (VITAMIN C) CHEW Chew 1 tablet by mouth daily.   Yes [provider]  clonazePAM (KLONOPIN) 0.5 MG tablet Take 0.25-0.5 mg by mouth 2 (two) times daily as needed for anxiety.   Yes [provider]  lisinopril (ZESTRIL) 5 MG tablet Take 5 mg by mouth daily.   Yes [provider]  MELATONIN PO Take 1 tablet by mouth at bedtime as needed (sleep).   Yes [provider]  montelukast (SINGULAIR) 10 MG tablet Take 10 mg by mouth at bedtime as needed (allergies).   Yes [provider]  Multiple Vitamin (MULTIVITAMIN PO) Take 1 tablet by mouth daily.   Yes [provider]  Multiple Vitamins-Minerals (ZINC PO) Take 1 tablet by mouth daily.    Yes [provider]  sertraline (ZOLOFT) 50 MG tablet Take 50 mg by mouth daily.   Yes [provider]    Inpatient Medications: Scheduled Meds: . enoxaparin (LOVENOX) injection  40 mg Subcutaneous Q24H  . guaiFENesin  600 mg Oral BID  . sertraline  50 mg Oral Daily  . sodium chloride flush  3 mL Intravenous Q12H   Continuous Infusions:  PRN Meds: acetaminophen **OR** acetaminophen, albuterol, clonazePAM, melatonin, ondansetron **OR** ondansetron (ZOFRAN) IV  Allergies:   No Known Allergies  Social History:   Social History   Socioeconomic History  . Marital status: Married    Spouse name: Not on file  . Number of children: Not on file  . Years of education: Not on file  . Highest education level: Not on file  Occupational History  . Not on file  Tobacco Use  . Smoking status: Former Smoker    Quit date: 2010    Years since quitting: 11.5  Vaping Use  . Vaping Use: Never used  Substance and Sexual Activity  . Alcohol use: Not on file  . Drug use: Not on file  . Sexual activity: Not on file  Other Topics Concern  . Not on file  Social History Narrative  . Not on file   Social Determinants of Health   Financial Resource Strain:   . Difficulty of Paying Living Expenses:   Food Insecurity:   . Worried About Programme researcher, broadcasting/film/videounning Out of Food in the Last Year:   . Baristaan Out of Food in the Last Year:   Transportation Needs:   . Freight forwarderLack of Transportation (Medical):   Marland Kitchen. Lack of Transportation (Non-Medical):   Physical Activity:   . Days of Exercise per Week:   . Minutes of Exercise per Session:   Stress:   . Feeling of Stress :   Social Connections:   . Frequency of Communication with Friends and Family:   . Frequency of Social Gatherings with Friends and Family:   . Attends Religious Services:   . Active Member of Clubs or Organizations:   . Attends BankerClub or Organization Meetings:   Marland Kitchen. Marital Status:   Intimate Partner Violence:   . Fear of Current or  Ex-Partner:   . Emotionally Abused:   Marland Kitchen. Physically Abused:   . Sexually Abused:     Family History:    Family History  Problem Relation Age of Onset  . Cancer Maternal Grandfather        Blood cancer     ROS:  Please see the history of present illness.   All other ROS reviewed and negative.     Physical Exam/Data:   Vitals:   08/10/19 1117 08/10/19 1520 08/10/19 1817 08/10/19 1925  BP: (!) 133/92 (!) 137/97 (!) 137/89 127/77  Pulse: 87 80 99 98  Resp: 21 18 20 20   Temp:   98.2 F (36.8 C) 98.3 F (36.8 C)  TempSrc:   Oral Oral  SpO2:  97% 100% 98% 94%  Weight:      Height:        Intake/Output Summary (Last 24 hours) at 08/10/2019 2246 Last data filed at 08/10/2019 1820 Gross per 24 hour  Intake 0 ml  Output --  Net 0 ml   Last 3 Weights 08/10/2019  Weight (lbs) 210 lb  Weight (kg) 95.255 kg     Body mass index is 30.13 kg/m.  General:  Well nourished, well developed, in no acute distress HEENT: normal Lymph: no adenopathy Neck: no JVD Endocrine:  No thryomegaly Vascular: No carotid bruits; FA pulses 2+ bilaterally without bruits  Cardiac:  normal S1, S2; RRR; no murmur  Lungs:  clear to auscultation bilaterally, no wheezing, rhonchi or rales  Abd: soft, nontender, no hepatomegaly  Ext: no edema Musculoskeletal:  No deformities, BUE and BLE strength normal and equal Skin: warm and dry  Neuro:  CNs 2-12 intact, no focal abnormalities noted Psych:  Normal affect   EKG:  The EKG was personally reviewed and demonstrates:  Sinus tachycardia at 110bpm with no ST changes Telemetry:  Telemetry was personally reviewed and demonstrates:  NSR  Relevant CV Studies: none  Laboratory Data:  High Sensitivity Troponin:   Recent Labs  Lab 08/10/19 1031 08/10/19 1200  TROPONINIHS 4 3     Chemistry Recent Labs  Lab 08/10/19 0008  NA 136  K 4.2  CL 102  CO2 26  GLUCOSE 140*  BUN 12  CREATININE 1.19  CALCIUM 9.5  GFRNONAA >60  GFRAA >60  ANIONGAP 8     Recent Labs  Lab 08/10/19 1031  PROT 7.2  ALBUMIN 4.0  AST 33  ALT 41  ALKPHOS 129*  BILITOT 0.9   Hematology Recent Labs  Lab 08/10/19 0008  WBC 11.4*  RBC 5.34  HGB 15.0  HCT 46.8  MCV 87.6  MCH 28.1  MCHC 32.1  RDW 14.9  PLT 288   BNPNo results for input(s): BNP, PROBNP in the last 168 hours.  DDimer No results for input(s): DDIMER in the last 168 hours.   Radiology/Studies:  DG Chest 2 View  Result Date: 08/10/2019 CLINICAL DATA:  Shortness of breath EXAM: CHEST - 2 VIEW COMPARISON:  None. FINDINGS: The heart size and mediastinal contours are within normal limits. Both lungs are clear. The visualized skeletal structures are unremarkable. IMPRESSION: No active cardiopulmonary disease. Electronically Signed   By: Katherine Mantle M.D.   On: 08/10/2019 00:33   CT Angio Chest PE W/Cm &/Or Wo Cm  Result Date: 08/10/2019 CLINICAL DATA:  Chest pain, shortness of breath EXAM: CT ANGIOGRAPHY CHEST WITH CONTRAST TECHNIQUE: Multidetector CT imaging of the chest was performed using the standard protocol during bolus administration of intravenous contrast. Multiplanar CT image reconstructions and MIPs were obtained to evaluate the vascular anatomy. CONTRAST:  57mL OMNIPAQUE IOHEXOL 350 MG/ML SOLN COMPARISON:  None. FINDINGS: Cardiovascular: Satisfactory opacification of the pulmonary arteries to the segmental level. No evidence of pulmonary embolism. Normal heart size. No pericardial effusion. Mediastinum/Nodes: No enlarged mediastinal, hilar, or axillary lymph nodes. Thyroid gland, trachea, and esophagus demonstrate no significant findings. Lungs/Pleura: Mild, diffuse bilateral bronchial wall thickening. Bandlike scarring and or atelectasis of the left lower lobe and lingula. 5 mm subpleural nodule of the dependent right lower lobe (series 6, image 68). No pleural effusion or pneumothorax. Upper Abdomen: No acute abnormality.  Hepatic steatosis. Musculoskeletal: No chest wall  abnormality. No acute or significant osseous findings. Review of the MIP images confirms the above  findings. IMPRESSION: 1. Negative examination for pulmonary embolism. 2. Mild, diffuse bilateral bronchial wall thickening, consistent with nonspecific infectious or inflammatory bronchitis. 3. There is a 5 mm subpleural nodule of the dependent right lower lobe. No follow-up needed if patient is low-risk. Non-contrast chest CT can be considered in 12 months if patient is high-risk. This recommendation follows the consensus statement: Guidelines for Management of Incidental Pulmonary Nodules Detected on CT Images: From the Fleischner Society 2017; Radiology 2017; 284:228-243. 4. Hepatic steatosis. Electronically Signed   By: Lauralyn Primes M.D.   On: 08/10/2019 11:18         Assessment and Plan:   1. SOB -this has been associated with nonproductive cough for 6 weeks -his SOB does worsen with exertion and has not improved with treatment with 2 rounds of steroids and Rx with Augmentin as well as inhalers and sx getting worse -he has a remote hx of smoking -chest CTA with no PE and no edema -? Whether this could be coronary ischemia -hsTrop neg x 2 and EKG is nonischemic -no mention of coronary artery calcifications on Chest CTA -2D echo pending -his CRFs include remote tobacco, obesity and HTN -suspect that he has acute on chronic bronchitis given CT findings of bronchial thickening and concern for bronchitis -his sx seem to be much worse when is as work so may he inhalational irritant -recommend coronary CTA to define coronary anatomy>>this can be done has an outpt  2.  HTN -BP controlled at 127/26mmHg -may want to consider changing ACE I to a different antihypertensive med given chronic cough  3.  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      For questions or updates, please contact [facility] Please consult [email or web address] for contact info under     Signed, Armanda Magic, MD  08/10/2019 10:46 PM

## 2019-08-10 NOTE — ED Notes (Signed)
Dr. Little at bedside.  

## 2019-08-10 NOTE — ED Provider Notes (Signed)
[facility] Provider Note   CSN: [id number] Arrival date & time: 08/09/19  2346     History Chief Complaint  Patient presents with  . Shortness of Breath    Timothy Murphy is a 54 y.o. male.  54 year old male with history of hypertension who presents with shortness of breath.  Patient states that 6 weeks ago, he began having some shortness of breath and a mild dry cough.  Initially he saw his PCP who thought he may have bronchitis, tried a round of antibiotics with no relief and later a course of steroids and albuterol which did not improve his symptoms.  He reports that his shortness of breath has progressively worsened and he has shortness of breath with any exertion including walking around his house or going upstairs.  He reports that he becomes severely short of breath, diaphoretic, dizzy, and with central chest tightness. Sx improve with rest.  He denies any leg swelling, weight gain, orthopnea, or PND.  He denies any fevers, sore throat, or sick contacts.  He saw PCP again for the symptoms and had a chest x-ray which showed some mild collapse of R lower lung (atelectasis?). He denies drug use, no hx of COPD/asthma. FH negative for early heart disease.  The history is provided by the patient.  Shortness of Breath      History reviewed. No pertinent past medical history.  There are no problems to display for this patient.   ** The histories are not reviewed yet. Please review them in the "History" navigator section and refresh this SmartLink.   PMH: HTN   History reviewed. No pertinent family history.  Social History   Tobacco Use  . Smoking status: Not on file  Substance Use Topics  . Alcohol use: Not on file  . Drug use: Not on file  previous tobacco use, denies current use Denies alcohol or drug use  Home Medications Prior to Admission medications   Medication Sig Start Date End Date Taking? Authorizing Provider  albuterol  (VENTOLIN HFA) 108 (90 Base) MCG/ACT inhaler Inhale 1-2 puffs into the lungs every 6 (six) hours as needed for wheezing or shortness of breath.   Yes [provider]  Bioflavonoid Products (VITAMIN C) CHEW Chew 1 tablet by mouth daily.   Yes [provider]  clonazePAM (KLONOPIN) 0.5 MG tablet Take 0.25-0.5 mg by mouth 2 (two) times daily as needed for anxiety.   Yes [provider]  lisinopril (ZESTRIL) 5 MG tablet Take 5 mg by mouth daily.   Yes [provider]  MELATONIN PO Take 1 tablet by mouth at bedtime as needed (sleep).   Yes [provider]  montelukast (SINGULAIR) 10 MG tablet Take 10 mg by mouth at bedtime as needed (allergies).   Yes [provider]  Multiple Vitamin (MULTIVITAMIN PO) Take 1 tablet by mouth daily.   Yes [provider]  Multiple Vitamins-Minerals (ZINC PO) Take 1 tablet by mouth daily.   Yes [provider]  sertraline (ZOLOFT) 50 MG tablet Take 50 mg by mouth daily.   Yes [provider]    Allergies    Patient has no known allergies.  Review of Systems   Review of Systems  Respiratory: Positive for shortness of breath.     Physical Exam Updated Vital Signs BP (!) 137/97 (BP Location: Right Arm)   Pulse 80   Temp 98.1 F (36.7 C) (Oral)   Resp 18   Ht 5\' 10"  (  1.778 m)   Wt (!) 95.3 kg   SpO2 100%   BMI 30.13 kg/m   Physical Exam Vitals and nursing note reviewed.  Constitutional:      General: He is not in acute distress.    Appearance: He is well-developed.  HENT:     Head: Normocephalic and atraumatic.  Eyes:     Conjunctiva/sclera: Conjunctivae normal.  Cardiovascular:     Rate and Rhythm: Normal rate and regular rhythm.     Heart sounds: Normal heart sounds. No murmur heard.   Pulmonary:     Effort: Pulmonary effort is normal.     Breath sounds: Normal breath sounds. No wheezing.  Abdominal:     General: Bowel sounds are normal. There is no distension.      Palpations: Abdomen is soft.     Tenderness: There is no abdominal tenderness.  Musculoskeletal:     Cervical back: Neck supple.     Right lower leg: No edema.     Left lower leg: No edema.  Skin:    General: Skin is warm and dry.  Neurological:     Mental Status: He is alert and oriented to person, place, and time.     Comments: Fluent speech  Psychiatric:        Judgment: Judgment normal.     ED Results / Procedures / Treatments   Labs (all labs ordered are listed, but only abnormal results are displayed) Labs Reviewed  CBC - Abnormal; Notable for the following components:      Result Value   WBC 11.4 (*)    All other components within normal limits  BASIC METABOLIC PANEL - Abnormal; Notable for the following components:   Glucose, Bld 140 (*)    All other components within normal limits  HEPATIC FUNCTION PANEL - Abnormal; Notable for the following components:   Alkaline Phosphatase 129 (*)    All other components within normal limits  SARS CORONAVIRUS 2 BY RT PCR (HOSPITAL ORDER, PERFORMED IN [facility])  RAPID URINE DRUG SCREEN, HOSP PERFORMED  TROPONIN I (HIGH SENSITIVITY)  TROPONIN I (HIGH SENSITIVITY)    EKG None  Radiology DG Chest 2 View  Result Date: 08/10/2019 CLINICAL DATA:  Shortness of breath EXAM: CHEST - 2 VIEW COMPARISON:  None. FINDINGS: The heart size and mediastinal contours are within normal limits. Both lungs are clear. The visualized skeletal structures are unremarkable. IMPRESSION: No active cardiopulmonary disease. Electronically Signed   By: Katherine Mantle M.D.   On: 08/10/2019 00:33   CT Angio Chest PE W/Cm &/Or Wo Cm  Result Date: 08/10/2019 CLINICAL DATA:  Chest pain, shortness of breath EXAM: CT ANGIOGRAPHY CHEST WITH CONTRAST TECHNIQUE: Multidetector CT imaging of the chest was performed using the standard protocol during bolus administration of intravenous contrast. Multiplanar CT image reconstructions and MIPs were  obtained to evaluate the vascular anatomy. CONTRAST:  32mL OMNIPAQUE IOHEXOL 350 MG/ML SOLN COMPARISON:  None. FINDINGS: Cardiovascular: Satisfactory opacification of the pulmonary arteries to the segmental level. No evidence of pulmonary embolism. Normal heart size. No pericardial effusion. Mediastinum/Nodes: No enlarged mediastinal, hilar, or axillary lymph nodes. Thyroid gland, trachea, and esophagus demonstrate no significant findings. Lungs/Pleura: Mild, diffuse bilateral bronchial wall thickening. Bandlike scarring and or atelectasis of the left lower lobe and lingula. 5 mm subpleural nodule of the dependent right lower lobe (series 6, image 68). No pleural effusion or pneumothorax. Upper Abdomen: No acute abnormality.  Hepatic steatosis. Musculoskeletal: No chest wall abnormality. No acute  or significant osseous findings. Review of the MIP images confirms the above findings. IMPRESSION: 1. Negative examination for pulmonary embolism. 2. Mild, diffuse bilateral bronchial wall thickening, consistent with nonspecific infectious or inflammatory bronchitis. 3. There is a 5 mm subpleural nodule of the dependent right lower lobe. No follow-up needed if patient is low-risk. Non-contrast chest CT can be considered in 12 months if patient is high-risk. This recommendation follows the consensus statement: Guidelines for Management of Incidental Pulmonary Nodules Detected on CT Images: From the Fleischner Society 2017; Radiology 2017; 284:228-243. 4. Hepatic steatosis. Electronically Signed   By: Lauralyn Primes M.D.   On: 08/10/2019 11:18    Procedures Procedures (including critical care time)  Medications Ordered in ED Medications  albuterol (PROVENTIL) (2.5 MG/3ML) 0.083% nebulizer solution 5 mg (5 mg Nebulization Not Given 08/10/19 0953)  iohexol (OMNIPAQUE) 350 MG/ML injection 52 mL (52 mLs Intravenous Contrast Given 08/10/19 1100)    ED Course  I have reviewed the triage vital signs and the nursing  notes.  Pertinent labs & imaging results that were available during my care of the patient were reviewed by me and considered in my medical decision making (see chart for details).    MDM Rules/Calculators/A&P                          Pt non-toxic on exam, reassuring VS, normal O2 sats.  No wheezing or crackles on exam.  Chest x-ray negative acute.  EKG without acute ischemic changes, sinus tachycardia.  Troponin negative.  Differential includes PE, occult pneumonia, or unstable angina given patient's associated diaphoresis and chest tightness with exertional activities.  Obtain CTA of chest which was negative for PE or infiltrate.  Shows some mild bronchial thickening consistent with bronchitis however he has no wheezing on repeat exam and states that he had no improvement after steroid course and inhalers by PCP.  I am concerned about the progressive worsening of his exertional symptoms and the fact that he is now having symptoms with very minimal exertion at home.  He has never had a functional study of his heart.  I recommended admission for further cardiac w/u. Discussed admission w/ [facility], Dr. Katrinka Blazing. COVID-19 negative. Final Clinical Impression(s) / ED Diagnoses Final diagnoses:  None    Rx / DC Orders ED Discharge Orders    None       Rolland Steinert, Ambrose Finland, MD 08/10/19 1542

## 2019-08-10 NOTE — ED Notes (Addendum)
Was advised per doctor that pt can eat and drink and relayed that to pt

## 2019-08-10 NOTE — ED Triage Notes (Signed)
Pt presents to ED POV. Pt c/o cough and SOB x6w. Pt reports that it is worsened on exertion. Pt seen by PCP and told he has a minimal LR side collapsed lung. Pt slightly tachypneic

## 2019-08-11 ENCOUNTER — Inpatient Hospital Stay (HOSPITAL_COMMUNITY): Payer: BC Managed Care – PPO

## 2019-08-11 ENCOUNTER — Encounter (HOSPITAL_COMMUNITY): Payer: Self-pay | Admitting: Internal Medicine

## 2019-08-11 DIAGNOSIS — R0602 Shortness of breath: Secondary | ICD-10-CM

## 2019-08-11 DIAGNOSIS — J40 Bronchitis, not specified as acute or chronic: Secondary | ICD-10-CM

## 2019-08-11 DIAGNOSIS — R0600 Dyspnea, unspecified: Secondary | ICD-10-CM

## 2019-08-11 DIAGNOSIS — I10 Essential (primary) hypertension: Secondary | ICD-10-CM

## 2019-08-11 LAB — RESPIRATORY PANEL BY PCR

## 2019-08-11 LAB — ECHOCARDIOGRAM COMPLETE
Area-P 1/2: 3.65 cm2
Height: 70 in
Weight: 3563.2 oz

## 2019-08-11 LAB — BASIC METABOLIC PANEL
Anion gap: 6 (ref 5–15)
BUN: 13 mg/dL (ref 6–20)
CO2: 28 mmol/L (ref 22–32)
Calcium: 9.2 mg/dL (ref 8.9–10.3)
Chloride: 102 mmol/L (ref 98–111)
Creatinine, Ser: 1.11 mg/dL (ref 0.61–1.24)
GFR calc Af Amer: >60 mL/min (ref >60)
GFR calc non Af Amer: >60 mL/min (ref >60)
Glucose, Bld: 108 mg/dL — ABNORMAL HIGH (ref 70–99)
Potassium: 4.2 mmol/L (ref 3.5–5.1)
Sodium: 136 mmol/L (ref 135–145)

## 2019-08-11 LAB — LIPID PANEL
Cholesterol: 172 mg/dL (ref 0–200)
HDL: 47 mg/dL (ref >40)
LDL Cholesterol: 96 mg/dL (ref 0–99)
Total CHOL/HDL Ratio: 3.7 RATIO
Triglycerides: 143 mg/dL (ref <150)
VLDL: 29 mg/dL (ref 0–40)

## 2019-08-11 LAB — CBC
HCT: 45.5 % (ref 39.0–52.0)
Hemoglobin: 14.8 g/dL (ref 13.0–17.0)
MCH: 28.3 pg (ref 26.0–34.0)
MCHC: 32.5 g/dL (ref 30.0–36.0)
MCV: 87 fL (ref 80.0–100.0)
Platelets: 251 10*3/uL (ref 150–400)
RBC: 5.23 MIL/uL (ref 4.22–5.81)
RDW: 14.9 % (ref 11.5–15.5)
WBC: 9.1 10*3/uL (ref 4.0–10.5)
nRBC: 0 % (ref 0.0–0.2)

## 2019-08-11 LAB — PROCALCITONIN: Procalcitonin: <0.1 ng/mL

## 2019-08-11 LAB — BRAIN NATRIURETIC PEPTIDE: B Natriuretic Peptide: 38 pg/mL (ref 0.0–100.0)

## 2019-08-11 MED ORDER — IPRATROPIUM-ALBUTEROL 0.5-2.5 (3) MG/3ML IN SOLN
3.0000 mL | Freq: Three times a day (TID) | RESPIRATORY_TRACT | Status: DC
  Administered 2019-08-11 (×3): 3 mL via RESPIRATORY_TRACT
  Filled 2019-08-11 (×3): qty 3

## 2019-08-11 MED ORDER — POLYETHYLENE GLYCOL 3350 17 G PO PACK: 17.0000 g | PACK | Freq: Every day | ORAL | Status: DC | PRN

## 2019-08-11 MED ORDER — BUDESONIDE 0.5 MG/2ML IN SUSP
0.5000 mg | Freq: Two times a day (BID) | RESPIRATORY_TRACT | Status: DC
  Administered 2019-08-11 (×2): 0.5 mg via RESPIRATORY_TRACT
  Filled 2019-08-11 (×2): qty 2

## 2019-08-11 MED ORDER — ACETAMINOPHEN 325 MG PO TABS: 650.0000 mg | ORAL_TABLET | Freq: Four times a day (QID) | ORAL | Status: DC | PRN

## 2019-08-11 MED ORDER — IPRATROPIUM-ALBUTEROL 0.5-2.5 (3) MG/3ML IN SOLN: 3.0000 mL | RESPIRATORY_TRACT | Status: DC | PRN

## 2019-08-11 MED ORDER — SENNOSIDES-DOCUSATE SODIUM 8.6-50 MG PO TABS: 2.0000 | ORAL_TABLET | Freq: Every evening | ORAL | Status: DC | PRN

## 2019-08-11 NOTE — Progress Notes (Signed)
Patient stated he places himself on/off CPAP when ready without assistance. RT will monitor as needed.

## 2019-08-11 NOTE — Progress Notes (Signed)
Progress Note  Patient Name: Timothy Murphy Date of Encounter: 08/11/2019  [facility] Cardiologist: Armanda Magic, MD   Subjective   Continues to have dry cough, but did feel improved with nebulizers. No chest pain. He was thinking more overnight and feels this is strongly related to conditions in his workplace, as this is always where his symptoms are the worst. Does note that this is also where he exerts himself the most.   Inpatient Medications    Scheduled Meds: . budesonide (PULMICORT) nebulizer solution  0.5 mg Nebulization BID  . enoxaparin (LOVENOX) injection  40 mg Subcutaneous Q24H  . guaiFENesin  600 mg Oral BID  . ipratropium-albuterol  3 mL Nebulization TID  . sertraline  50 mg Oral Daily  . sodium chloride flush  3 mL Intravenous Q12H   Continuous Infusions:  PRN Meds: acetaminophen, albuterol, clonazePAM, ipratropium-albuterol, melatonin, ondansetron **OR** ondansetron (ZOFRAN) IV, polyethylene glycol, senna-docusate   Vital Signs    Vitals:   08/10/19 2356 08/11/19 0017 08/11/19 0434 08/11/19 0723  BP:  122/85 (!) 139/95 (!) 146/87  Pulse: 86 84 70 76  Resp: 18 18 18 15   Temp:  98.4 F (36.9 C) 98.3 F (36.8 C) 98.2 F (36.8 C)  TempSrc:  Oral Oral Oral  SpO2: 97% 98% 98% 97%  Weight:   (!) 101 kg   Height:        Intake/Output Summary (Last 24 hours) at 08/11/2019 0852 Last data filed at 08/10/2019 1820 Gross per 24 hour  Intake 0 ml  Output --  Net 0 ml   Last 3 Weights 08/11/2019 08/10/2019  Weight (lbs) 222 lb 11.2 oz 210 lb  Weight (kg) 101.016 kg 95.255 kg      Telemetry    Sinus rhythm/sinus tachycardia - Personally Reviewed  ECG    Sinus tachycardia - Personally Reviewed  Physical Exam   GEN: No acute distress.   Neck: No JVD Cardiac: RRR, no murmurs, rubs, or gallops.  Respiratory: Clear to auscultation bilaterally. GI: Soft, nontender, non-distended  MS: No edema; No deformity. Neuro:  Nonfocal  Psych: Normal affect    Labs    High Sensitivity Troponin:   Recent Labs  Lab 08/10/19 1031 08/10/19 1200  TROPONINIHS 4 3      Chemistry Recent Labs  Lab 08/10/19 0008 08/10/19 1031 08/11/19 0441  NA 136  --  136  K 4.2  --  4.2  CL 102  --  102  CO2 26  --  28  GLUCOSE 140*  --  108*  BUN 12  --  13  CREATININE 1.19  --  1.11  CALCIUM 9.5  --  9.2  PROT  --  7.2  --   ALBUMIN  --  4.0  --   AST  --  33  --   ALT  --  41  --   ALKPHOS  --  129*  --   BILITOT  --  0.9  --   GFRNONAA >60  --  >60  GFRAA >60  --  >60  ANIONGAP 8  --  6     Hematology Recent Labs  Lab 08/10/19 0008 08/11/19 0441  WBC 11.4* 9.1  RBC 5.34 5.23  HGB 15.0 14.8  HCT 46.8 45.5  MCV 87.6 87.0  MCH 28.1 28.3  MCHC 32.1 32.5  RDW 14.9 14.9  PLT 288 251    BNPNo results for input(s): BNP, PROBNP in the last 168 hours.   DDimer No  results for input(s): DDIMER in the last 168 hours.   Radiology    DG Chest 2 View  Result Date: 08/10/2019 CLINICAL DATA:  Shortness of breath EXAM: CHEST - 2 VIEW COMPARISON:  None. FINDINGS: The heart size and mediastinal contours are within normal limits. Both lungs are clear. The visualized skeletal structures are unremarkable. IMPRESSION: No active cardiopulmonary disease. Electronically Signed   By: Katherine Mantle M.D.   On: 08/10/2019 00:33   CT Angio Chest PE W/Cm &/Or Wo Cm  Result Date: 08/10/2019 CLINICAL DATA:  Chest pain, shortness of breath EXAM: CT ANGIOGRAPHY CHEST WITH CONTRAST TECHNIQUE: Multidetector CT imaging of the chest was performed using the standard protocol during bolus administration of intravenous contrast. Multiplanar CT image reconstructions and MIPs were obtained to evaluate the vascular anatomy. CONTRAST:  55mL OMNIPAQUE IOHEXOL 350 MG/ML SOLN COMPARISON:  None. FINDINGS: Cardiovascular: Satisfactory opacification of the pulmonary arteries to the segmental level. No evidence of pulmonary embolism. Normal heart size. No pericardial effusion.  Mediastinum/Nodes: No enlarged mediastinal, hilar, or axillary lymph nodes. Thyroid gland, trachea, and esophagus demonstrate no significant findings. Lungs/Pleura: Mild, diffuse bilateral bronchial wall thickening. Bandlike scarring and or atelectasis of the left lower lobe and lingula. 5 mm subpleural nodule of the dependent right lower lobe (series 6, image 68). No pleural effusion or pneumothorax. Upper Abdomen: No acute abnormality.  Hepatic steatosis. Musculoskeletal: No chest wall abnormality. No acute or significant osseous findings. Review of the MIP images confirms the above findings. IMPRESSION: 1. Negative examination for pulmonary embolism. 2. Mild, diffuse bilateral bronchial wall thickening, consistent with nonspecific infectious or inflammatory bronchitis. 3. There is a 5 mm subpleural nodule of the dependent right lower lobe. No follow-up needed if patient is low-risk. Non-contrast chest CT can be considered in 12 months if patient is high-risk. This recommendation follows the consensus statement: Guidelines for Management of Incidental Pulmonary Nodules Detected on CT Images: From the Fleischner Society 2017; Radiology 2017; 284:228-243. 4. Hepatic steatosis. Electronically Signed   By: Lauralyn Primes M.D.   On: 08/10/2019 11:18    Cardiac Studies   Echo pending today  Patient Profile     54 y.o. male with PMH hypertension, anxiety/depression, OSA on CPAP, remote tobacco use. We are consulted for the evaluation of shortness of breath and cough at the request of Dr. Madelyn Flavors (per patient's family request).  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 In the PA-timed contrast images, there does not appear to be significant coronary calcifications, though this is not a dedicated study.  -if echo unremarkable, would follow up with cardiology as an outpatient.  Hypertension: -on lisinopril 5 mg daily at home. Has been on for many years. I discussed changing to ARB or chlorthalidone to rule out possible cough from ACEi, but he feels this is not related and would like to continue lisinopril at this time.  Prevention: -he is very concerned about risk of a future heart attack. He works in the [employer] and was having [employer] for breakfast on my interview today -will add lipids to labs. Will need outpatient follow up for further primary prevention.  [facility] will sign off.  If echo abnormal we will reassess. Medication Recommendations:  Continue lisinopril Other recommendations (labs, testing, etc):  none Follow up as an outpatient:  We will arrange for follow up with Dr. Mayford Knife.  For questions or updates, please contact [facility] Please consult [email or web address] for contact info under     Signed, Jodelle Red, MD  08/11/2019, 8:52 AM

## 2019-08-11 NOTE — Progress Notes (Signed)
PROGRESS NOTE    Timothy HarpsJohn Murphy  WUJ:[id number] DOB: 10/29/1965 DOA: 08/09/2019 PCP: Clayborn Heronankins, Victoria R, MD   Brief Narrative:  54 year old with history of HTN, anxiety, OSA on CPAP, tobacco use, depression admitted for progressive dyspnea on exertion, nonproductive cough.  Recently outpatient given Augmentin, prednisone and bronchodilator without much improvement in his symptoms.  CTA shows concerns for acute bronchitis.  Assessment & Plan:   Principal Problem:   Dyspnea on exertion Active Problems:   Bronchitis   Obesity (BMI 30.0-34.9)   OSA on CPAP   Hypertension   Depression   Anxiety  Acute respiratory distress secondary to acute bronchitis Atypical chest pain -Supplemental oxygen.  Aggressive scheduled and as needed bronchodilators, Pulmicort twice daily -Cardiac enzymes negative, EKG unremarkable -CTA chest negative for PE but shows bronchitis.  No obvious coronary artery disease seen on CTA -Hold off on diuretics. -Echocardiogram-pending -Mucinex, supplemental oxygen as needed -Respiratory viral panel-negative -Check procalcitonin, BNP  Essential hypertension -Lisinopril daily  5 mm subpleural right lower lobe lung nodule -Recommend repeat CT chest in 12 months  Obstructive sleep apnea -On CPAP  Depression and anxiety -Continue Zoloft.  Clonazepam as needed for anxiety    DVT prophylaxis: Lovenox Code Status: Full code Family Communication:    Status is: Inpatient  Remains inpatient appropriate because:Inpatient level of care appropriate due to severity of illness   Dispo: The patient is from: Home              Anticipated d/c is to: Home              Anticipated d/c date is: 1 day              Patient currently is not medically stable to d/c.  Still having exertional dyspnea requiring aggressive bronchodilators.    Body mass index is 31.95 kg/m.     Subjective: Feels mild sob with exertion and non productive coughing. Not feeling back to  baseline yet.   Review of Systems Otherwise negative except as per HPI, including: General = no fevers, chills, dizziness,  fatigue HEENT/EYES = negative for loss of vision, double vision, blurred vision,  sore throa Cardiovascular= negative for chest pain, palpitation Respiratory/lungs= negative for  hemoptysis,  Gastrointestinal= negative for nausea, vomiting, abdominal pain Genitourinary= negative for Dysuria MSK = Negative for arthralgia, myalgias Neurology= Negative for headache, numbness, tingling  Psychiatry= Negative for suicidal and homocidal ideation Skin= Negative for Rash   Examination: Constitutional: Not in acute distress Respiratory: bil coarse BS- mild  Cardiovascular: Normal sinus rhythm, no rubs Abdomen: Nontender nondistended good bowel sounds Musculoskeletal: No edema noted Skin: No rashes seen Neurologic: CN 2-12 grossly intact.  And nonfocal Psychiatric: Normal judgment and insight. Alert and oriented x 3. Normal mood.      Objective: Vitals:   08/10/19 2356 08/11/19 0017 08/11/19 0434 08/11/19 0723  BP:  122/85 (!) 139/95 (!) 146/87  Pulse: 86 84 70 76  Resp: 18 18 18 15   Temp:  98.4 F (36.9 C) 98.3 F (36.8 C) 98.2 F (36.8 C)  TempSrc:  Oral Oral Oral  SpO2: 97% 98% 98% 97%  Weight:   (!) 101 kg   Height:        Intake/Output Summary (Last 24 hours) at 08/11/2019 0737 Last data filed at 08/10/2019 1820 Gross per 24 hour  Intake 0 ml  Output --  Net 0 ml   Filed Weights   08/10/19 0023 08/11/19 0434  Weight: (!) 95.3 kg (!) 101 kg  Data Reviewed:   CBC: Recent Labs  Lab 08/10/19 0008 08/11/19 0441  WBC 11.4* 9.1  HGB 15.0 14.8  HCT 46.8 45.5  MCV 87.6 87.0  PLT 288 251   Basic Metabolic Panel: Recent Labs  Lab 08/10/19 0008 08/11/19 0441  NA 136 136  K 4.2 4.2  CL 102 102  CO2 26 28  GLUCOSE 140* 108*  BUN 12 13  CREATININE 1.19 1.11  CALCIUM 9.5 9.2   GFR: Estimated Creatinine Clearance: 93.8 mL/min (by  C-G formula based on SCr of 1.11 mg/dL). Liver Function Tests: Recent Labs  Lab 08/10/19 1031  AST 33  ALT 41  ALKPHOS 129*  BILITOT 0.9  PROT 7.2  ALBUMIN 4.0   No results for input(s): LIPASE, AMYLASE in the last 168 hours. No results for input(s): AMMONIA in the last 168 hours. Coagulation Profile: No results for input(s): INR, PROTIME in the last 168 hours. Cardiac Enzymes: No results for input(s): CKTOTAL, CKMB, CKMBINDEX, TROPONINI in the last 168 hours. BNP (last 3 results) No results for input(s): PROBNP in the last 8760 hours. HbA1C: No results for input(s): HGBA1C in the last 72 hours. CBG: No results for input(s): GLUCAP in the last 168 hours. Lipid Profile: No results for input(s): CHOL, HDL, LDLCALC, TRIG, CHOLHDL, LDLDIRECT in the last 72 hours. Thyroid Function Tests: Recent Labs    08/10/19 1638  TSH 1.891   Anemia Panel: No results for input(s): VITAMINB12, FOLATE, FERRITIN, TIBC, IRON, RETICCTPCT in the last 72 hours. Sepsis Labs: No results for input(s): PROCALCITON, LATICACIDVEN in the last 168 hours.  Recent Results (from the past 240 hour(s))  SARS Coronavirus 2 by RT PCR (hospital order, performed in [facility] hospital lab) Nasopharyngeal Nasopharyngeal Swab     Status: None   Collection Time: 08/10/19 11:14 AM   Specimen: Nasopharyngeal Swab  Result Value Ref Range Status   SARS Coronavirus 2 NEGATIVE NEGATIVE Final    Comment: (NOTE) SARS-CoV-2 target nucleic acids are NOT DETECTED.  The SARS-CoV-2 RNA is generally detectable in upper and lower respiratory specimens during the acute phase of infection. The lowest concentration of SARS-CoV-2 viral copies this assay can detect is 250 copies / mL. A negative result does not preclude SARS-CoV-2 infection and should not be used as the sole basis for treatment or other patient management decisions.  A negative result may occur with improper specimen collection / handling, submission of specimen  other than nasopharyngeal swab, presence of viral mutation(s) within the areas targeted by this assay, and inadequate number of viral copies (<250 copies / mL). A negative result must be combined with clinical observations, patient history, and epidemiological information.  Fact Sheet for Patients:   BoilerBrush.com.cy  Fact Sheet for Healthcare Providers: https://pope.com/  This test is not yet approved or  cleared by the [place] FDA and has been authorized for detection and/or diagnosis of SARS-CoV-2 by FDA under an Emergency Use Authorization (EUA).  This EUA will remain in effect (meaning this test can be used) for the duration of the COVID-19 declaration under Section 564(b)(1) of the Act, 21 U.S.C. section 360bbb-3(b)(1), unless the authorization is terminated or revoked sooner.  Performed at [facility], [address] [place], [place] [address]   Respiratory Panel by PCR     Status: None   Collection Time: 08/10/19 11:12 PM   Specimen: Nasopharyngeal Swab; Respiratory  Result Value Ref Range Status   Adenovirus NOT DETECTED NOT DETECTED Final   Coronavirus 229E NOT  DETECTED NOT DETECTED Final    Comment: (NOTE) The Coronavirus on the Respiratory Panel, DOES NOT test for the novel  Coronavirus (2019 nCoV)    Coronavirus HKU1 NOT DETECTED NOT DETECTED Final   Coronavirus NL63 NOT DETECTED NOT DETECTED Final   Coronavirus OC43 NOT DETECTED NOT DETECTED Final   Metapneumovirus NOT DETECTED NOT DETECTED Final   Rhinovirus / Enterovirus NOT DETECTED NOT DETECTED Final   Influenza A NOT DETECTED NOT DETECTED Final   Influenza B NOT DETECTED NOT DETECTED Final   Parainfluenza Virus 1 NOT DETECTED NOT DETECTED Final   Parainfluenza Virus 2 NOT DETECTED NOT DETECTED Final   Parainfluenza Virus 3 NOT DETECTED NOT DETECTED Final   Parainfluenza Virus 4 NOT DETECTED NOT DETECTED Final   Respiratory Syncytial Virus NOT  DETECTED NOT DETECTED Final   Bordetella pertussis NOT DETECTED NOT DETECTED Final   Chlamydophila pneumoniae NOT DETECTED NOT DETECTED Final   Mycoplasma pneumoniae NOT DETECTED NOT DETECTED Final    Comment: Performed at [facility], [address], [place], [place] [address]         Radiology Studies: DG Chest 2 View  Result Date: 08/10/2019 CLINICAL DATA:  Shortness of breath EXAM: CHEST - 2 VIEW COMPARISON:  None. FINDINGS: The heart size and mediastinal contours are within normal limits. Both lungs are clear. The visualized skeletal structures are unremarkable. IMPRESSION: No active cardiopulmonary disease. Electronically Signed   By: Katherine Mantle M.D.   On: 08/10/2019 00:33   CT Angio Chest PE W/Cm &/Or Wo Cm  Result Date: 08/10/2019 CLINICAL DATA:  Chest pain, shortness of breath EXAM: CT ANGIOGRAPHY CHEST WITH CONTRAST TECHNIQUE: Multidetector CT imaging of the chest was performed using the standard protocol during bolus administration of intravenous contrast. Multiplanar CT image reconstructions and MIPs were obtained to evaluate the vascular anatomy. CONTRAST:  66mL OMNIPAQUE IOHEXOL 350 MG/ML SOLN COMPARISON:  None. FINDINGS: Cardiovascular: Satisfactory opacification of the pulmonary arteries to the segmental level. No evidence of pulmonary embolism. Normal heart size. No pericardial effusion. Mediastinum/Nodes: No enlarged mediastinal, hilar, or axillary lymph nodes. Thyroid gland, trachea, and esophagus demonstrate no significant findings. Lungs/Pleura: Mild, diffuse bilateral bronchial wall thickening. Bandlike scarring and or atelectasis of the left lower lobe and lingula. 5 mm subpleural nodule of the dependent right lower lobe (series 6, image 68). No pleural effusion or pneumothorax. Upper Abdomen: No acute abnormality.  Hepatic steatosis. Musculoskeletal: No chest wall abnormality. No acute or significant osseous findings. Review of the MIP images confirms the  above findings. IMPRESSION: 1. Negative examination for pulmonary embolism. 2. Mild, diffuse bilateral bronchial wall thickening, consistent with nonspecific infectious or inflammatory bronchitis. 3. There is a 5 mm subpleural nodule of the dependent right lower lobe. No follow-up needed if patient is low-risk. Non-contrast chest CT can be considered in 12 months if patient is high-risk. This recommendation follows the consensus statement: Guidelines for Management of Incidental Pulmonary Nodules Detected on CT Images: From the Fleischner Society 2017; Radiology 2017; 284:228-243. 4. Hepatic steatosis. Electronically Signed   By: Lauralyn Primes M.D.   On: 08/10/2019 11:18        Scheduled Meds: . enoxaparin (LOVENOX) injection  40 mg Subcutaneous Q24H  . guaiFENesin  600 mg Oral BID  . sertraline  50 mg Oral Daily  . sodium chloride flush  3 mL Intravenous Q12H   Continuous Infusions:   LOS: 1 day   Time spent= 35 mins    Denice Cardon Joline Maxcy, MD [facility]  If 7PM-7AM, please contact night-coverage  08/11/2019, 7:37 AM

## 2019-08-11 NOTE — Progress Notes (Signed)
  Echocardiogram 2D Echocardiogram has been performed.  Timothy Murphy 08/11/2019, 9:21 AM

## 2019-08-12 ENCOUNTER — Encounter: Payer: Self-pay | Admitting: Allergy and Immunology

## 2019-08-12 LAB — BASIC METABOLIC PANEL
Anion gap: 9 (ref 5–15)
BUN: 16 mg/dL (ref 6–20)
CO2: 25 mmol/L (ref 22–32)
Calcium: 9.1 mg/dL (ref 8.9–10.3)
Chloride: 102 mmol/L (ref 98–111)
Creatinine, Ser: 1.17 mg/dL (ref 0.61–1.24)
GFR calc Af Amer: >60 mL/min (ref >60)
GFR calc non Af Amer: >60 mL/min (ref >60)
Glucose, Bld: 120 mg/dL — ABNORMAL HIGH (ref 70–99)
Potassium: 4.3 mmol/L (ref 3.5–5.1)
Sodium: 136 mmol/L (ref 135–145)

## 2019-08-12 LAB — MAGNESIUM: Magnesium: 2.1 mg/dL (ref 1.7–2.4)

## 2019-08-12 LAB — CBC
HCT: 45 % (ref 39.0–52.0)
Hemoglobin: 14.5 g/dL (ref 13.0–17.0)
MCH: 27.9 pg (ref 26.0–34.0)
MCHC: 32.2 g/dL (ref 30.0–36.0)
MCV: 86.7 fL (ref 80.0–100.0)
Platelets: 235 10*3/uL (ref 150–400)
RBC: 5.19 MIL/uL (ref 4.22–5.81)
RDW: 14.8 % (ref 11.5–15.5)
WBC: 9.6 10*3/uL (ref 4.0–10.5)
nRBC: 0 % (ref 0.0–0.2)

## 2019-08-12 MED ORDER — PREDNISONE 20 MG PO TABS: 40.0000 mg | ORAL_TABLET | Freq: Every day | ORAL | 0 refills | Status: AC

## 2019-08-12 MED ORDER — FLUTICASONE-SALMETEROL 100-50 MCG/DOSE IN AEPB
1.0000 | INHALATION_SPRAY | Freq: Two times a day (BID) | RESPIRATORY_TRACT | 0 refills | Status: DC
Start: 2019-08-12 — End: 2019-08-26

## 2019-08-12 MED ORDER — IPRATROPIUM-ALBUTEROL 0.5-2.5 (3) MG/3ML IN SOLN: 3.0000 mL | Freq: Four times a day (QID) | RESPIRATORY_TRACT | 0 refills | Status: DC | PRN

## 2019-08-12 MED FILL — predniSONE 20 MG TABS: 20 | 5 days supply | Qty: 10 | Fill #0

## 2019-08-12 MED FILL — IPRAT-ALBUT 0.5-3(2.5) MG/3: 0.5-2.5 (3) | 30 days supply | Qty: 360 | Fill #0

## 2019-08-12 NOTE — Progress Notes (Signed)
D/C instructions given and reviewed. IV removed. Tolerated well. Awaiting TOC pharmacy with meds.Wife at bedside to transport pt home.

## 2019-08-12 NOTE — Progress Notes (Signed)
Brief cardiology note: Echo reviewed, largely normal. Please see signoff note from yesterday. We have arranged for outpatient follow up with Dr. Mayford Knife on 09/19/19.  Jodelle Red, MD, PhD [facility]  [address] [place], [place] [address] [phone]

## 2019-08-12 NOTE — Discharge Summary (Signed)
Physician Discharge Summary  Timothy Murphy LPF:[id number] DOB: September 02, 1965 DOA: 08/09/2019  PCP: Clayborn Heron, MD  Admit date: 08/09/2019 Discharge date: 08/12/2019  Admitted From: Home Home Disposition: Home  Recommendations for Outpatient Follow-up:  1. Follow up with PCP in 1-2 weeks 2. Please obtain BMP/CBC in one week your next doctors visit.  3. DuoNeb, Advair and prednisone prescribed  Discharge Condition: Stable CODE STATUS: Full code Diet recommendation: Heart healthy  Brief/Interim Summary: 54 year old with history of HTN, anxiety, OSA on CPAP, tobacco use, depression admitted for progressive dyspnea on exertion, nonproductive cough.  Recently outpatient given Augmentin, prednisone and bronchodilator without much improvement in his symptoms.  CTA shows concerns for acute bronchitis.  Overall his echocardiogram in the hospital was unremarkable.  Over the course of 48 hours his symptoms improved, was cleared by cardiology to be discharged.  Today symptomatically feeling much better and wants to go home.  Wife present at bedside during my interaction with him in the hospital  Assessment & Plan:   Principal Problem:   Dyspnea on exertion Active Problems:   Bronchitis   Obesity (BMI 30.0-34.9)   OSA on CPAP   Hypertension   Depression   Anxiety  Acute respiratory distress secondary to acute bronchitis, improved Atypical chest pain -Supplemental oxygen.    Bronchodilators, Advair and prednisone prescription given to upon discharge -Cardiac enzymes negative, EKG unremarkable -CTA chest negative for PE but shows bronchitis.  No obvious coronary artery disease seen on CTA -Hold off on diuretics. -Echocardiogram-EF 60%, overall unremarkable -Mucinex, supplemental oxygen as needed -Respiratory viral panel-negative -Procalcitonin and BNP negative  Essential hypertension -Lisinopril daily  5 mm subpleural right lower lobe lung nodule -Recommend repeat CT chest in 12  months  Obstructive sleep apnea -On CPAP  Depression and anxiety -Continue Zoloft.  Clonazepam as needed for anxiety    Body mass index is 31.95 kg/m.         Discharge Diagnoses:  Principal Problem:   Dyspnea on exertion Active Problems:   Bronchitis   Obesity (BMI 30.0-34.9)   OSA on CPAP   Hypertension   Depression   Anxiety      Consultations:  Cardiology  Subjective: Feels great no complaints.  Patient wants to go home.  Wife present at bedside  Discharge Exam: Vitals:   08/11/19 2022 08/12/19 0416  BP:  (!) 128/97  Pulse: 89 85  Resp: 16 18  Temp:  98 F (36.7 C)  SpO2: 99% 99%   Vitals:   08/11/19 1942 08/11/19 2019 08/11/19 2022 08/12/19 0416  BP: (!) 134/98   (!) 128/97  Pulse: (!) 109  89 85  Resp: 18  16 18   Temp: 97.7 F (36.5 C)   98 F (36.7 C)  TempSrc: Oral   Oral  SpO2: 97% 98% 99% 99%  Weight:    (!) 101 kg  Height:        General: Pt is alert, awake, not in acute distress Cardiovascular: RRR, S1/S2 +, no rubs, no gallops Respiratory: Very mild bibasilar rhonchi Abdominal: Soft, NT, ND, bowel sounds + Extremities: no edema, no cyanosis  Discharge Instructions   Allergies as of 08/12/2019   No Known Allergies     Medication List    TAKE these medications   albuterol 108 (90 Base) MCG/ACT inhaler Commonly known as: VENTOLIN HFA Inhale 1-2 puffs into the lungs every 6 (six) hours as needed for wheezing or shortness of breath.   clonazePAM 0.5 MG tablet Commonly known as: 08/14/2019  Take 0.25-0.5 mg by mouth 2 (two) times daily as needed for anxiety.   Fluticasone-Salmeterol 100-50 MCG/DOSE Aepb Commonly known as: Advair Diskus Inhale 1 puff into the lungs in the morning and at bedtime.   ipratropium-albuterol 0.5-2.5 (3) MG/3ML Soln Commonly known as: DUONEB Take 3 mLs by nebulization every 6 (six) hours as needed.   lisinopril 5 MG tablet Commonly known as: ZESTRIL Take 5 mg by mouth daily.    MELATONIN PO Take 1 tablet by mouth at bedtime as needed (sleep).   montelukast 10 MG tablet Commonly known as: SINGULAIR Take 10 mg by mouth at bedtime as needed (allergies).   MULTIVITAMIN PO Take 1 tablet by mouth daily.   predniSONE 20 MG tablet Commonly known as: Deltasone Take 2 tablets (40 mg total) by mouth daily with breakfast for 5 days.   sertraline 50 MG tablet Commonly known as: ZOLOFT Take 50 mg by mouth daily.   Vitamin C Chew Chew 1 tablet by mouth daily.   ZINC PO Take 1 tablet by mouth daily.       Follow-up Information    Quintella Reichert, MD Follow up.   Specialty: Cardiology Why: Hospital follow-up scheduled for 09/19/2019 at 10:40am. Please arrive 15 minutes early for check-in. If this date/time does not work for you, please call our office to reschedule. Contact information: [address] [place] [address] [phone]        Clayborn Heron, MD. Go on 08/19/2019.   Specialty: Family Medicine Why: :15pm Contact information: [address] [place] [address] [phone]              No Known Allergies  You were cared for by a hospitalist during your hospital stay. If you have any questions about your discharge medications or the care you received while you were in the hospital after you are discharged, you can call the unit and asked to speak with the hospitalist on call if the hospitalist that took care of you is not available. Once you are discharged, your primary care physician will handle any further medical issues. Please note that no refills for any discharge medications will be authorized once you are discharged, as it is imperative that you return to your primary care physician (or establish a relationship with a primary care physician if you do not have one) for your aftercare needs so that they can reassess your need for medications and monitor your lab values.   Procedures/Studies: DG Chest 2  View  Result Date: 08/10/2019 CLINICAL DATA:  Shortness of breath EXAM: CHEST - 2 VIEW COMPARISON:  None. FINDINGS: The heart size and mediastinal contours are within normal limits. Both lungs are clear. The visualized skeletal structures are unremarkable. IMPRESSION: No active cardiopulmonary disease. Electronically Signed   By: Katherine Mantle M.D.   On: 08/10/2019 00:33   CT Angio Chest PE W/Cm &/Or Wo Cm  Result Date: 08/10/2019 CLINICAL DATA:  Chest pain, shortness of breath EXAM: CT ANGIOGRAPHY CHEST WITH CONTRAST TECHNIQUE: Multidetector CT imaging of the chest was performed using the standard protocol during bolus administration of intravenous contrast. Multiplanar CT image reconstructions and MIPs were obtained to evaluate the vascular anatomy. CONTRAST:  52mL OMNIPAQUE IOHEXOL 350 MG/ML SOLN COMPARISON:  None. FINDINGS: Cardiovascular: Satisfactory opacification of the pulmonary arteries to the segmental level. No evidence of pulmonary embolism. Normal heart size. No pericardial effusion. Mediastinum/Nodes: No enlarged mediastinal, hilar, or axillary lymph nodes. Thyroid gland, trachea, and esophagus demonstrate  no significant findings. Lungs/Pleura: Mild, diffuse bilateral bronchial wall thickening. Bandlike scarring and or atelectasis of the left lower lobe and lingula. 5 mm subpleural nodule of the dependent right lower lobe (series 6, image 68). No pleural effusion or pneumothorax. Upper Abdomen: No acute abnormality.  Hepatic steatosis. Musculoskeletal: No chest wall abnormality. No acute or significant osseous findings. Review of the MIP images confirms the above findings. IMPRESSION: 1. Negative examination for pulmonary embolism. 2. Mild, diffuse bilateral bronchial wall thickening, consistent with nonspecific infectious or inflammatory bronchitis. 3. There is a 5 mm subpleural nodule of the dependent right lower lobe. No follow-up needed if patient is low-risk. Non-contrast chest CT can  be considered in 12 months if patient is high-risk. This recommendation follows the consensus statement: Guidelines for Management of Incidental Pulmonary Nodules Detected on CT Images: From the Fleischner Society 2017; Radiology 2017; 284:228-243. 4. Hepatic steatosis. Electronically Signed   By: Lauralyn PrimesAlex  Bibbey M.D.   On: 08/10/2019 11:18   ECHOCARDIOGRAM COMPLETE  Result Date: 08/11/2019    ECHOCARDIOGRAM REPORT   Patient Name:   Timothy Murphy [place] Date of Exam: 08/11/2019 Medical Rec #:  [id number]    Height:       70.0 in Accession #:    [id number]   Weight:       222.7 lb Date of Birth:  04/02/1965    BSA:          2.185 m Patient Age:    53 years     BP:           146/87 mmHg Patient Gender: M            HR:           76 bpm. Exam Location:  Inpatient Procedure: 2D Echo Indications:    dyspnea  History:        Patient has no prior history of Echocardiogram examinations.                 Risk Factors:Hypertension and Former Smoker.  Sonographer:    Celene SkeenVijay Shankar RDCS (AE) Referring Phys: [phone] [facility]  Sonographer Comments: No parasternal window and suboptimal subcostal window. IMPRESSIONS  1. Very difficult study. Poor echo windowns. LV function is normal without WMA.  2. Left ventricular ejection fraction, by estimation, is 60 to 65%. The left ventricle has normal function. The left ventricle has no regional wall motion abnormalities. Left ventricular diastolic parameters were normal.  3. Right ventricular systolic function is normal. The right ventricular size is normal.  4. The mitral valve is grossly normal. No evidence of mitral valve regurgitation. No evidence of mitral stenosis.  5. The aortic valve was not well visualized. Aortic valve regurgitation is not visualized. No aortic stenosis is present. FINDINGS  Left Ventricle: Left ventricular ejection fraction, by estimation, is 60 to 65%. The left ventricle has normal function. The left ventricle has no regional wall motion abnormalities. The left  ventricular internal cavity size was normal in size. There is  no left ventricular hypertrophy. Left ventricular diastolic parameters were normal. Right Ventricle: The right ventricular size is normal. No increase in right ventricular wall thickness. Right ventricular systolic function is normal. Left Atrium: Left atrial size was normal in size. Right Atrium: Right atrial size was not well visualized. Pericardium: Trivial pericardial effusion is present. Mitral Valve: The mitral valve is grossly normal. No evidence of mitral valve regurgitation. No evidence of mitral valve stenosis. Tricuspid Valve: The tricuspid valve is not well visualized.  Tricuspid valve regurgitation is not demonstrated. Aortic Valve: The aortic valve was not well visualized. Aortic valve regurgitation is not visualized. No aortic stenosis is present. Pulmonic Valve: The pulmonic valve was not well visualized. Pulmonic valve regurgitation is not visualized. Aorta: The aortic root was not well visualized. Venous: The inferior vena cava was not well visualized. IAS/Shunts: The interatrial septum was not well visualized.   Diastology LV e' lateral:   12.60 cm/s LV E/e' lateral: 4.7 LV e' medial:    8.81 cm/s LV E/e' medial:  6.7  LEFT ATRIUM           Index LA Vol (A2C): 30.8 ml 14.10 ml/m  AORTIC VALVE LVOT Vmax:   67.90 cm/s LVOT Vmean:  54.700 cm/s LVOT VTI:    0.140 m MITRAL VALVE MV Area (PHT): 3.65 cm    SHUNTS MV Decel Time: 208 msec    Systemic VTI: 0.14 m MV E velocity: 59.10 cm/s MV A velocity: 52.30 cm/s MV E/A ratio:  1.13 Lennie Odor MD Electronically signed by Lennie Odor MD Signature Date/Time: 08/11/2019/12:00:29 PM    Final       The results of significant diagnostics from this hospitalization (including imaging, microbiology, ancillary and laboratory) are listed below for reference.     Microbiology: Recent Results (from the past 240 hour(s))  SARS Coronavirus 2 by RT PCR (hospital order, performed in [facility]  hospital lab) Nasopharyngeal Nasopharyngeal Swab     Status: None   Collection Time: 08/10/19 11:14 AM   Specimen: Nasopharyngeal Swab  Result Value Ref Range Status   SARS Coronavirus 2 NEGATIVE NEGATIVE Final    Comment: (NOTE) SARS-CoV-2 target nucleic acids are NOT DETECTED.  The SARS-CoV-2 RNA is generally detectable in upper and lower respiratory specimens during the acute phase of infection. The lowest concentration of SARS-CoV-2 viral copies this assay can detect is 250 copies / mL. A negative result does not preclude SARS-CoV-2 infection and should not be used as the sole basis for treatment or other patient management decisions.  A negative result may occur with improper specimen collection / handling, submission of specimen other than nasopharyngeal swab, presence of viral mutation(s) within the areas targeted by this assay, and inadequate number of viral copies (<250 copies / mL). A negative result must be combined with clinical observations, patient history, and epidemiological information.  Fact Sheet for Patients:   BoilerBrush.com.cy  Fact Sheet for Healthcare Providers: https://pope.com/  This test is not yet approved or  cleared by the [place] FDA and has been authorized for detection and/or diagnosis of SARS-CoV-2 by FDA under an Emergency Use Authorization (EUA).  This EUA will remain in effect (meaning this test can be used) for the duration of the COVID-19 declaration under Section 564(b)(1) of the Act, 21 U.S.C. section 360bbb-3(b)(1), unless the authorization is terminated or revoked sooner.  Performed at [facility], [address], [place], [place] [address]   Respiratory Panel by PCR     Status: None   Collection Time: 08/10/19 11:12 PM   Specimen: Nasopharyngeal Swab; Respiratory  Result Value Ref Range Status   Adenovirus NOT DETECTED NOT DETECTED Final   Coronavirus 229E NOT DETECTED  NOT DETECTED Final    Comment: (NOTE) The Coronavirus on the Respiratory Panel, DOES NOT test for the novel  Coronavirus (2019 nCoV)    Coronavirus HKU1 NOT DETECTED NOT DETECTED Final   Coronavirus NL63 NOT DETECTED NOT DETECTED Final   Coronavirus OC43 NOT DETECTED NOT DETECTED  Final   Metapneumovirus NOT DETECTED NOT DETECTED Final   Rhinovirus / Enterovirus NOT DETECTED NOT DETECTED Final   Influenza A NOT DETECTED NOT DETECTED Final   Influenza B NOT DETECTED NOT DETECTED Final   Parainfluenza Virus 1 NOT DETECTED NOT DETECTED Final   Parainfluenza Virus 2 NOT DETECTED NOT DETECTED Final   Parainfluenza Virus 3 NOT DETECTED NOT DETECTED Final   Parainfluenza Virus 4 NOT DETECTED NOT DETECTED Final   Respiratory Syncytial Virus NOT DETECTED NOT DETECTED Final   Bordetella pertussis NOT DETECTED NOT DETECTED Final   Chlamydophila pneumoniae NOT DETECTED NOT DETECTED Final   Mycoplasma pneumoniae NOT DETECTED NOT DETECTED Final    Comment: Performed at [facility], [address], [place], [place] [address]     Labs: BNP (last 3 results) Recent Labs    08/11/19 0742  BNP 38.0   Basic Metabolic Panel: Recent Labs  Lab 08/10/19 0008 08/11/19 0441 08/12/19 0413  NA 136 136 136  K 4.2 4.2 4.3  CL 102 102 102  CO2 GLUCOSE 140* 108* 120*  BUN CREATININE 1.19 1.11 1.17  CALCIUM 9.5 9.2 9.1  MG  --   --  2.1   Liver Function Tests: Recent Labs  Lab 08/10/19 1031  AST 33  ALT 41  ALKPHOS 129*  BILITOT 0.9  PROT 7.2  ALBUMIN 4.0   No results for input(s): LIPASE, AMYLASE in the last 168 hours. No results for input(s): AMMONIA in the last 168 hours. CBC: Recent Labs  Lab 08/10/19 0008 08/11/19 0441 08/12/19 0413  WBC 11.4* 9.1 9.6  HGB 15.0 14.8 14.5  HCT 46.8 45.5 45.0  MCV 87.6 87.0 86.7  PLT 288 251 235   Cardiac Enzymes: No results for input(s): CKTOTAL, CKMB, CKMBINDEX, TROPONINI in the last 168 hours. BNP: Invalid  input(s): POCBNP CBG: No results for input(s): GLUCAP in the last 168 hours. D-Dimer No results for input(s): DDIMER in the last 72 hours. Hgb A1c No results for input(s): HGBA1C in the last 72 hours. Lipid Profile Recent Labs    08/11/19 0441  CHOL 172  HDL 47  LDLCALC 96  TRIG 143  CHOLHDL 3.7   Thyroid function studies Recent Labs    08/10/19 1638  TSH 1.891   Anemia work up No results for input(s): VITAMINB12, FOLATE, FERRITIN, TIBC, IRON, RETICCTPCT in the last 72 hours. Urinalysis No results found for: COLORURINE, APPEARANCEUR, LABSPEC, PHURINE, GLUCOSEU, HGBUR, BILIRUBINUR, KETONESUR, PROTEINUR, UROBILINOGEN, NITRITE, LEUKOCYTESUR Sepsis Labs Invalid input(s): PROCALCITONIN,  WBC,  LACTICIDVEN Microbiology Recent Results (from the past 240 hour(s))  SARS Coronavirus 2 by RT PCR (hospital order, performed in [facility] hospital lab) Nasopharyngeal Nasopharyngeal Swab     Status: None   Collection Time: 08/10/19 11:14 AM   Specimen: Nasopharyngeal Swab  Result Value Ref Range Status   SARS Coronavirus 2 NEGATIVE NEGATIVE Final    Comment: (NOTE) SARS-CoV-2 target nucleic acids are NOT DETECTED.  The SARS-CoV-2 RNA is generally detectable in upper and lower respiratory specimens during the acute phase of infection. The lowest concentration of SARS-CoV-2 viral copies this assay can detect is 250 copies / mL. A negative result does not preclude SARS-CoV-2 infection and should not be used as the sole basis for treatment or other patient management decisions.  A negative result may occur with improper specimen collection / handling, submission of specimen other than nasopharyngeal swab, presence of viral mutation(s) within the areas targeted by  this assay, and inadequate number of viral copies (<250 copies / mL). A negative result must be combined with clinical observations, patient history, and epidemiological information.  Fact Sheet for Patients:    BoilerBrush.com.cy  Fact Sheet for Healthcare Providers: https://pope.com/  This test is not yet approved or  cleared by the [place] FDA and has been authorized for detection and/or diagnosis of SARS-CoV-2 by FDA under an Emergency Use Authorization (EUA).  This EUA will remain in effect (meaning this test can be used) for the duration of the COVID-19 declaration under Section 564(b)(1) of the Act, 21 U.S.C. section 360bbb-3(b)(1), unless the authorization is terminated or revoked sooner.  Performed at [facility], [address] [place], [place] [address]   Respiratory Panel by PCR     Status: None   Collection Time: 08/10/19 11:12 PM   Specimen: Nasopharyngeal Swab; Respiratory  Result Value Ref Range Status   Adenovirus NOT DETECTED NOT DETECTED Final   Coronavirus 229E NOT DETECTED NOT DETECTED Final    Comment: (NOTE) The Coronavirus on the Respiratory Panel, DOES NOT test for the novel  Coronavirus (2019 nCoV)    Coronavirus HKU1 NOT DETECTED NOT DETECTED Final   Coronavirus NL63 NOT DETECTED NOT DETECTED Final   Coronavirus OC43 NOT DETECTED NOT DETECTED Final   Metapneumovirus NOT DETECTED NOT DETECTED Final   Rhinovirus / Enterovirus NOT DETECTED NOT DETECTED Final   Influenza A NOT DETECTED NOT DETECTED Final   Influenza B NOT DETECTED NOT DETECTED Final   Parainfluenza Virus 1 NOT DETECTED NOT DETECTED Final   Parainfluenza Virus 2 NOT DETECTED NOT DETECTED Final   Parainfluenza Virus 3 NOT DETECTED NOT DETECTED Final   Parainfluenza Virus 4 NOT DETECTED NOT DETECTED Final   Respiratory Syncytial Virus NOT DETECTED NOT DETECTED Final   Bordetella pertussis NOT DETECTED NOT DETECTED Final   Chlamydophila pneumoniae NOT DETECTED NOT DETECTED Final   Mycoplasma pneumoniae NOT DETECTED NOT DETECTED Final    Comment: Performed at [facility], [address], [place], [place] [address]     Time  coordinating discharge:  I have spent 35 minutes face to face with the patient and on the ward discussing the patients care, assessment, plan and disposition with other care givers. >50% of the time was devoted counseling the patient about the risks and benefits of treatment/Discharge disposition and coordinating care.   SIGNED:   Dimple Nanas, MD  [facility] 08/12/2019, 10:29 AM   If 7PM-7AM, please contact night-coverage

## 2019-08-12 NOTE — Progress Notes (Signed)
Notified by nursing that patient had called after discharge to ask about getting a nebulizer machine. Spoke with patient on the phone who thought he was going to get it at the pharmacy when he picked up his medications. Discussed picking it up at store front vs delivery. Patient stated that he would pick it up at [facility] location on [facility] in the morning. He says he will be fine without it tonight. Notified [employer] who will retrieve order from MD and process for pick up. Notified MD of pending order request via email - MD agreed. No further TOC needs identified.   Hortencia Conradi, RN MSN CCM Transitions of Care [phone]

## 2019-08-12 NOTE — Progress Notes (Signed)
  Mobility Specialist Criteria Algorithm Info.  SATURATION QUALIFICATIONS: (This note is used to comply with regulatory documentation for home oxygen)  Patient Saturations on Room Air at Rest = 98%  Patient Saturations on Room Air while Ambulating = 96%  Patient Saturations on 0 Liters of oxygen while Ambulating = n/a%  Please briefly explain why patient needs home oxygen.  Mobility Team:  HOB elevated: Activity: Ambulated in hall (in chair before and after ambulation) Range of motion: Active;All extremities Level of assistance: Independent Assistive device: None Minutes sitting in chair:  Minutes stood: 5 minutes Minutes ambulated: 5 minutes Distance ambulated (ft): 500 ft Mobility response: Tolerated well Bed Position: Chair (Recliner chair)  Pt tolerated ambulation well with no complaints.  08/12/2019 10:18 AM

## 2019-08-26 ENCOUNTER — Other Ambulatory Visit: Payer: Self-pay

## 2019-08-26 ENCOUNTER — Encounter: Payer: Self-pay | Admitting: Pulmonary Disease

## 2019-08-26 ENCOUNTER — Ambulatory Visit: Payer: BC Managed Care – PPO | Admitting: Pulmonary Disease

## 2019-08-26 VITALS — BP 132/76 | HR 112 | Temp 96.1°F | Ht 71.0 in | Wt 229.0 lb

## 2019-08-26 DIAGNOSIS — R059 Cough, unspecified: Secondary | ICD-10-CM

## 2019-08-26 DIAGNOSIS — J4530 Mild persistent asthma, uncomplicated: Secondary | ICD-10-CM

## 2019-08-26 DIAGNOSIS — R05 Cough: Secondary | ICD-10-CM

## 2019-08-26 MED ORDER — FLOVENT HFA 220 MCG/ACT IN AERO: 2.0000 | INHALATION_SPRAY | Freq: Two times a day (BID) | RESPIRATORY_TRACT | 4 refills | Status: DC

## 2019-08-26 MED ORDER — AEROCHAMBER MV MISC: 0 refills | Status: DC

## 2019-08-26 NOTE — Patient Instructions (Signed)
Start flovent 2 puffs twice daily with spacer Continue to use the duoneb nebulizer treatments as needed every 4-6 hours We will check pulmonary function tests and call you with the results.

## 2019-08-26 NOTE — Progress Notes (Signed)
Patient ID: Timothy Murphy, male    DOB: 12/16/1965, 54 y.o.   MRN: [id number]  Chief Complaint  Patient presents with  . Consult    referred by PCP for chronic cough, lung nodule.      Referring provider: Clayborn Heron, MD  HPI: Timothy Murphy is a 54 year old male with GERD, hiatal hernia, obstructive sleep apnea on CPAP, hypertension and history of childhood asthma who presents with chronic cough.   He reports developing a cough 10 weeks ago after an upper respiratory viral infection. The cough is dry and non-productive. Initially given a course of Augmentin, then course of prednisone with albuterol inhaler, and then a second course of prednisone without any improvement in symptoms. He then presented to the hospital on 08/09/19 - 08/12/19 with the cough and shortness of breath. CTA chest was negative for pulmonary emboli and showed bronchial wall thickening. Difficult study to interpret due to expiratory phase in which the scan was done. Echo showed normal systolic and diastolic function. He had a negative procalcitonin and negative respiratory viral PCR panel. Per the discharge summary, the patient was feeling better in regards to his cough and dyspnea with nebulizer treatments and prednisone. He was sent home with duonebs and a 5 day course of 40mg  prednisone daily.   He reports the cough is not much improved since hospitalization. He complains of fatigue and shortness of breath with exertion. He works at as of a Production designer, theatre/television/film. He does not smoke, vape or use e-cigarettes. He does have GERD and is taking PPI therapy. He also has a motorized bed and sleeps with the head of the bed elevated.          No PFTs on file at time of interview.   Allergies  Allergen Reactions  . Shellfish Allergy     Abdomen pain     Immunization History  Administered Date(s) Administered  . Influenza Inj Mdck Quad Pf 09/28/2017  . Influenza Split 12/11/2013  . Influenza, Seasonal, Injecte,  Preservative Fre 11/07/2014  . PFIZER SARS-COV-2 Vaccination 03/28/2019, 04/21/2019  . Tdap 02/10/2009    Past Medical History:  Diagnosis Date  . Anxiety   . Asthma    as a child  . Depression   . Hypertension   . OSA (obstructive sleep apnea)   . OSA on CPAP   . Seasonal allergies     Tobacco History: Social History   Tobacco Use  Smoking Status Former Smoker  . Packs/day: 1.00  . Years: 8.00  . Pack years: 8.00  . Types: Cigarettes  . Quit date: 2010  . Years since quitting: 11.6  Smokeless Tobacco Never Used   Counseling given: Not Answered   Outpatient Medications Prior to Visit  Medication Sig Dispense Refill  . albuterol (VENTOLIN HFA) 108 (90 Base) MCG/ACT inhaler Inhale 1-2 puffs into the lungs every 6 (six) hours as needed for wheezing or shortness of breath.    2011 Bioflavonoid Products (VITAMIN C) CHEW Chew 1 tablet by mouth daily.    . clonazePAM (KLONOPIN) 0.5 MG tablet Take 0.25-0.5 mg by mouth 2 (two) times daily as needed for anxiety.    . diclofenac (VOLTAREN) 75 MG EC tablet TAKE 1 TABLET BY MOUTH TWICE A DAY 50 tablet 2  . diphenhydrAMINE (BENADRYL) 25 MG tablet Take 25 mg by mouth every 6 (six) hours as needed.    Marland Kitchen EPINEPHrine (AUVI-Q) 0.3 mg/0.3 mL IJ SOAJ injection Use as directed for severe allergic  reaction 2 each 1  . ipratropium-albuterol (DUONEB) 0.5-2.5 (3) MG/3ML SOLN Take 3 mLs by nebulization every 6 (six) hours as needed. 360 mL 0  . lisinopril (PRINIVIL,ZESTRIL) 10 MG tablet Take 10 mg by mouth daily.  3  . Melatonin 10 MG CAPS Take by mouth.    . Multiple Vitamin (MULTIVITAMIN PO) Take 1 tablet by mouth daily.    . Multiple Vitamins-Minerals (ZINC PO) Take 1 tablet by mouth daily.    Marland Kitchen omeprazole (PRILOSEC) 10 MG capsule Take 10 mg by mouth daily as needed.     . sertraline (ZOLOFT) 50 MG tablet Take 100 mg by mouth daily.     Marland Kitchen MELATONIN PO Take 1 tablet by mouth at bedtime as needed (sleep).    . Azelastine HCl 0.15 % SOLN PLACE 1-2  SPRAYS INTO BOTH NOSTRILS 2 (TWO) TIMES DAILY AS NEEDED. (Patient not taking: Reported on 08/26/2019) 30 mL 5  . Fluticasone Propionate (XHANCE) 93 MCG/ACT EXHU Place 2 sprays into both nostrils 2 (two) times daily as needed. (Patient not taking: Reported on 08/26/2019) 32 mL 5  . Fluticasone-Salmeterol (ADVAIR DISKUS) 100-50 MCG/DOSE AEPB Inhale 1 puff into the lungs in the morning and at bedtime. (Patient not taking: Reported on 08/26/2019) 60 each 0  . lisinopril (ZESTRIL) 5 MG tablet Take 5 mg by mouth daily. (Patient not taking: Reported on 08/26/2019)    . montelukast (SINGULAIR) 10 MG tablet TAKE 1 TABLET BY MOUTH EVERYDAY AT BEDTIME (Patient not taking: Reported on 08/26/2019) 30 tablet 5  . montelukast (SINGULAIR) 10 MG tablet Take 10 mg by mouth at bedtime as needed (allergies). (Patient not taking: Reported on 08/26/2019)    . sertraline (ZOLOFT) 50 MG tablet Take 50 mg by mouth daily.     No facility-administered medications prior to visit.     Review of Systems:   Constitutional:   No  weight loss, night sweats,  Fevers, chills, fatigue, or  lassitude.  HEENT:   No headaches,  Difficulty swallowing,  Tooth/dental problems, or  Sore throat,                No sneezing, itching, ear ache, nasal congestion, post nasal drip,   CV:  No chest pain,  Orthopnea, PND, swelling in lower extremities, anasarca, dizziness, palpitations, syncope.   GI  No heartburn, indigestion, abdominal pain, nausea, vomiting, diarrhea, change in bowel habits, loss of appetite, bloody stools.   Resp: No shortness of breath with exertion or at rest.  No excess mucus, no productive cough,  No non-productive cough,  No coughing up of blood.  No change in color of mucus.  No wheezing.  No chest wall deformity  Skin: no rash or lesions.  GU: no dysuria, change in color of urine, no urgency or frequency.  No flank pain, no hematuria   MS:  No joint pain or swelling.  No decreased range of motion.  No back  pain.    Physical Exam  BP 132/76 (BP Location: Left Arm, Cuff Size: Normal)   Pulse (!) 112   Temp (!) 96.1 F (35.6 C) (Temporal)   Ht 5\' 11"  (1.803 m)   Wt 229 lb (103.9 kg)   SpO2 96%   BMI 31.94 kg/m   GEN: A/Ox3; pleasant , NAD, well nourished    HEENT:  [place]/AT,  EACs-clear, TMs-wnl, NOSE-clear, THROAT-clear, no lesions, no postnasal drip or exudate noted.   NECK:  Supple w/ fair ROM; no JVD; normal carotid impulses w/o bruits; no  thyromegaly or nodules palpated; no lymphadenopathy.    RESP  Clear  P & A; w/o, wheezes/ rales/ or rhonchi. no accessory muscle use, no dullness to percussion  CARD:  RRR, no m/r/g, no peripheral edema, pulses intact, no cyanosis or clubbing.  GI:   Soft & nt; nml bowel sounds; no organomegaly or masses detected.   Musco: Warm bil, no deformities or joint swelling noted.   Neuro: alert, no focal deficits noted.    Skin: Warm, no lesions or rashes    Lab Results:  CBC    Component Value Date/Time   WBC 9.6 08/12/2019 0413   RBC 5.19 08/12/2019 0413   HGB 14.5 08/12/2019 0413   HCT 45.0 08/12/2019 0413   PLT 235 08/12/2019 0413   MCV 86.7 08/12/2019 0413   MCH 27.9 08/12/2019 0413   MCHC 32.2 08/12/2019 0413   RDW 14.8 08/12/2019 0413    BMET    Component Value Date/Time   NA 136 08/12/2019 0413   K 4.3 08/12/2019 0413   CL 102 08/12/2019 0413   CO2 25 08/12/2019 0413   GLUCOSE 120 (H) 08/12/2019 0413   BUN 16 08/12/2019 0413   CREATININE 1.17 08/12/2019 0413   CALCIUM 9.1 08/12/2019 0413   GFRNONAA >60 08/12/2019 0413   GFRAA >60 08/12/2019 0413    BNP    Component Value Date/Time   BNP 38.0 08/11/2019 0742    ProBNP No results found for: PROBNP  Imaging: DG Chest 2 View  Result Date: 08/10/2019 CLINICAL DATA:  Shortness of breath EXAM: CHEST - 2 VIEW COMPARISON:  None. FINDINGS: The heart size and mediastinal contours are within normal limits. Both lungs are clear. The visualized skeletal structures are  unremarkable. IMPRESSION: No active cardiopulmonary disease. Electronically Signed   By: Katherine Mantle M.D.   On: 08/10/2019 00:33   CT Angio Chest PE W/Cm &/Or Wo Cm  Result Date: 08/10/2019 CLINICAL DATA:  Chest pain, shortness of breath EXAM: CT ANGIOGRAPHY CHEST WITH CONTRAST TECHNIQUE: Multidetector CT imaging of the chest was performed using the standard protocol during bolus administration of intravenous contrast. Multiplanar CT image reconstructions and MIPs were obtained to evaluate the vascular anatomy. CONTRAST:  31mL OMNIPAQUE IOHEXOL 350 MG/ML SOLN COMPARISON:  None. FINDINGS: Cardiovascular: Satisfactory opacification of the pulmonary arteries to the segmental level. No evidence of pulmonary embolism. Normal heart size. No pericardial effusion. Mediastinum/Nodes: No enlarged mediastinal, hilar, or axillary lymph nodes. Thyroid gland, trachea, and esophagus demonstrate no significant findings. Lungs/Pleura: Mild, diffuse bilateral bronchial wall thickening. Bandlike scarring and or atelectasis of the left lower lobe and lingula. 5 mm subpleural nodule of the dependent right lower lobe (series 6, image 68). No pleural effusion or pneumothorax. Upper Abdomen: No acute abnormality.  Hepatic steatosis. Musculoskeletal: No chest wall abnormality. No acute or significant osseous findings. Review of the MIP images confirms the above findings. IMPRESSION: 1. Negative examination for pulmonary embolism. 2. Mild, diffuse bilateral bronchial wall thickening, consistent with nonspecific infectious or inflammatory bronchitis. 3. There is a 5 mm subpleural nodule of the dependent right lower lobe. No follow-up needed if patient is low-risk. Non-contrast chest CT can be considered in 12 months if patient is high-risk. This recommendation follows the consensus statement: Guidelines for Management of Incidental Pulmonary Nodules Detected on CT Images: From the Fleischner Society 2017; Radiology 2017;  [id number]. 4. Hepatic steatosis. Electronically Signed   By: Lauralyn Primes M.D.   On: 08/10/2019 11:18   ECHOCARDIOGRAM COMPLETE  Result Date: 08/11/2019  ECHOCARDIOGRAM REPORT   Patient Name:   Timothy Murphy Date of Exam: 08/11/2019 Medical Rec #:  [id number]    Height:       70.0 in Accession #:    [id number]   Weight:       222.7 lb Date of Birth:  1965/12/21    BSA:          2.185 m Patient Age:    53 years     BP:           146/87 mmHg Patient Gender: M            HR:           76 bpm. Exam Location:  Inpatient Procedure: 2D Echo Indications:    dyspnea  History:        Patient has no prior history of Echocardiogram examinations.                 Risk Factors:Hypertension and Former Smoker.  Sonographer:    Celene Skeen RDCS (AE) Referring Phys: [phone] [facility]  Sonographer Comments: No parasternal window and suboptimal subcostal window. IMPRESSIONS  1. Very difficult study. Poor echo windowns. LV function is normal without WMA.  2. Left ventricular ejection fraction, by estimation, is 60 to 65%. The left ventricle has normal function. The left ventricle has no regional wall motion abnormalities. Left ventricular diastolic parameters were normal.  3. Right ventricular systolic function is normal. The right ventricular size is normal.  4. The mitral valve is grossly normal. No evidence of mitral valve regurgitation. No evidence of mitral stenosis.  5. The aortic valve was not well visualized. Aortic valve regurgitation is not visualized. No aortic stenosis is present. FINDINGS  Left Ventricle: Left ventricular ejection fraction, by estimation, is 60 to 65%. The left ventricle has normal function. The left ventricle has no regional wall motion abnormalities. The left ventricular internal cavity size was normal in size. There is  no left ventricular hypertrophy. Left ventricular diastolic parameters were normal. Right Ventricle: The right ventricular size is normal. No increase in right ventricular  wall thickness. Right ventricular systolic function is normal. Left Atrium: Left atrial size was normal in size. Right Atrium: Right atrial size was not well visualized. Pericardium: Trivial pericardial effusion is present. Mitral Valve: The mitral valve is grossly normal. No evidence of mitral valve regurgitation. No evidence of mitral valve stenosis. Tricuspid Valve: The tricuspid valve is not well visualized. Tricuspid valve regurgitation is not demonstrated. Aortic Valve: The aortic valve was not well visualized. Aortic valve regurgitation is not visualized. No aortic stenosis is present. Pulmonic Valve: The pulmonic valve was not well visualized. Pulmonic valve regurgitation is not visualized. Aorta: The aortic root was not well visualized. Venous: The inferior vena cava was not well visualized. IAS/Shunts: The interatrial septum was not well visualized.   Diastology LV e' lateral:   12.60 cm/s LV E/e' lateral: 4.7 LV e' medial:    8.81 cm/s LV E/e' medial:  6.7  LEFT ATRIUM           Index LA Vol (A2C): 30.8 ml 14.10 ml/m  AORTIC VALVE LVOT Vmax:   67.90 cm/s LVOT Vmean:  54.700 cm/s LVOT VTI:    0.140 m MITRAL VALVE MV Area (PHT): 3.65 cm    SHUNTS MV Decel Time: 208 msec    Systemic VTI: 0.14 m MV E velocity: 59.10 cm/s MV A velocity: 52.30 cm/s MV E/A ratio:  1.13 Lennie Odor MD Electronically signed  by Lennie OdorWesley O'Neal MD Signature Date/Time: 08/11/2019/12:00:29 PM    Final       Assessment & Plan:   Timothy HarpsJohn Murphy is a 54 year old male with GERD, hiatal hernia, obstructive sleep apnea on CPAP, hypertension and history of childhood asthma who presents with chronic cough.   His cough appears secondary to reactive airways disease after an upper respiratory viral infection. Prior to the infection he reports no issues of cough or with his breathing. CT imaging shows thickened airways indicating inflammation.  PFTs on 09/01/19 show mild restrictive defect with normal DLCO. Differential may include small  airways disease such as bronchiolitis.    We will treat him for post viral reactive airways disorder with fluticasone 220mcg 2 puffs twice daily with a spacer. He can continue duoneb nebulizer treatments as needed every 4-6 hours.   He is to follow up in 2 months. If symptoms persist we can consider performing inflammatory work up based on his recent PFT findings.  Martina SinnerJonathan B Jahid Weida, MD 09/05/2019

## 2019-09-01 ENCOUNTER — Ambulatory Visit (INDEPENDENT_AMBULATORY_CARE_PROVIDER_SITE_OTHER): Payer: BC Managed Care – PPO | Admitting: Pulmonary Disease

## 2019-09-01 ENCOUNTER — Other Ambulatory Visit: Payer: Self-pay

## 2019-09-01 DIAGNOSIS — R05 Cough: Secondary | ICD-10-CM

## 2019-09-01 DIAGNOSIS — R059 Cough, unspecified: Secondary | ICD-10-CM

## 2019-09-01 NOTE — Progress Notes (Signed)
Full PFT performed today. °

## 2019-09-02 LAB — PULMONARY FUNCTION TEST
DL/VA % pred: 127 %
DL/VA: 5.58 ml/min/mmHg/L
DLCO cor % pred: 90 %
DLCO cor: 27.94 ml/min/mmHg
DLCO unc % pred: 89 %
DLCO unc: 27.86 ml/min/mmHg
FEF 25-75 Post: 2.59 L/sec
FEF 25-75 Pre: 3.9 L/sec
FEF2575-%Change-Post: -33 %
FEF2575-%Pred-Post: 71 %
FEF2575-%Pred-Pre: 107 %
FEV1-%Change-Post: -6 %
FEV1-%Pred-Post: 60 %
FEV1-%Pred-Pre: 65 %
FEV1-Post: 2.54 L
FEV1-Pre: 2.71 L
FEV1FVC-%Change-Post: -1 %
FEV1FVC-%Pred-Pre: 115 %
FEV6-%Change-Post: -4 %
FEV6-%Pred-Post: 55 %
FEV6-%Pred-Pre: 58 %
FEV6-Post: 2.89 L
FEV6-Pre: 3.03 L
FEV6FVC-%Pred-Post: 103 %
FEV6FVC-%Pred-Pre: 103 %
FVC-%Change-Post: -4 %
FVC-%Pred-Post: 53 %
FVC-%Pred-Pre: 56 %
FVC-Post: 2.89 L
FVC-Pre: 3.03 L
Post FEV1/FVC ratio: 88 %
Post FEV6/FVC ratio: 100 %
Pre FEV1/FVC ratio: 89 %
Pre FEV6/FVC Ratio: 100 %
RV % pred: 112 %
RV: 2.44 L
TLC % pred: 78 %
TLC: 5.78 L

## 2019-09-04 ENCOUNTER — Telehealth: Payer: Self-pay | Admitting: Pulmonary Disease

## 2019-09-04 NOTE — Telephone Encounter (Signed)
Rec'd faxed Return to Work/Restrictions Form from [employer]. Called patient and left message to advise of fee and document that need signature. Will give forms to Dr. Francine Graven when he returns to the office on 09/12/2019-pr

## 2019-09-05 NOTE — Telephone Encounter (Signed)
Patient came to the office today and signed necessary documents, awaiting Dr. Lanora Manis return for form completion -pr

## 2019-09-12 NOTE — Telephone Encounter (Signed)
Spoke with Dr. Francine Graven -he states these forms need to be completed by the pt's pcp. I called and left message on pt vm advising to contact his pcp for form completion. -pr

## 2019-09-15 ENCOUNTER — Telehealth: Payer: Self-pay | Admitting: Pulmonary Disease

## 2019-09-16 NOTE — Telephone Encounter (Signed)
I will inform Dr. Francine Graven of this open his return to the office -pr

## 2019-09-18 NOTE — Telephone Encounter (Signed)
Elisha Headland, NP would like to have Dr. Francine Graven to address forms upon his return to the office on 09/25/2019 - I will give to JD on 09/25/2019 for his signature-pr

## 2019-09-18 NOTE — Telephone Encounter (Signed)
Gave form to Elisha Headland, APP of the day to see if he sign form so that I can send with medical records request-pr

## 2019-09-18 NOTE — Progress Notes (Deleted)
Cardiology Office Note:    Date:  09/18/2019   ID:  Timothy Murphy, DOB [birth year], MRN [id number]  PCP:  Clayborn Heron, MD  Cardiologist:  Armanda Magic, MD    Referring MD: Clayborn Heron, MD   No chief complaint on file.   History of Present Illness:    Timothy Murphy is a 54 y.o. male with a hx of HTN, Anxiety/depression, OSA on CPAP and remote tobacco use who was initially seen in the ER with complaints of SOB and cough. He was treated with 2 rounds of steroids, antibx and inhalers with no improvement.  Chest CTA was negative for edema or PE.  hsTrop was negative and 2D echo was normal.   It was suspected that he had acute on chronic bronchitis given CT findings of bronchial thickening and concern for bronchitis.  His sx seemed to be much worse when is as work so may he inhalational irritant.  He underwent coronary CTA showing   Past Medical History:  Diagnosis Date  . Anxiety   . Asthma    as a child  . Depression   . Hypertension   . OSA (obstructive sleep apnea)   . OSA on CPAP   . Seasonal allergies     No past surgical history on file.  Current Medications: No outpatient medications have been marked as taking for the 09/19/19 encounter (Appointment) with Quintella Reichert, MD.     Allergies:   Shellfish allergy   Social History   Socioeconomic History  . Marital status: Married    Spouse name: Not on file  . Number of children: Not on file  . Years of education: Not on file  . Highest education level: Not on file  Occupational History  . Occupation: Community education officer: Production manager  Tobacco Use  . Smoking status: Former Smoker    Packs/day: 1.00    Years: 8.00    Pack years: 8.00    Types: Cigarettes    Quit date: 2010    Years since quitting: 11.6  . Smokeless tobacco: Never Used  Vaping Use  . Vaping Use: Never used  Substance and Sexual Activity  . Alcohol use: Yes    Comment: once a week seasonal  . Drug use: Never  . Sexual  activity: Yes    Partners: Female  Other Topics Concern  . Not on file  Social History Narrative   ** Merged History Encounter **       Social Determinants of Health   Financial Resource Strain:   . Difficulty of Paying Living Expenses: Not on file  Food Insecurity:   . Worried About Programme researcher, broadcasting/film/video in the Last Year: Not on file  . Ran Out of Food in the Last Year: Not on file  Transportation Needs:   . Lack of Transportation (Medical): Not on file  . Lack of Transportation (Non-Medical): Not on file  Physical Activity:   . Days of Exercise per Week: Not on file  . Minutes of Exercise per Session: Not on file  Stress:   . Feeling of Stress : Not on file  Social Connections:   . Frequency of Communication with Friends and Family: Not on file  . Frequency of Social Gatherings with Friends and Family: Not on file  . Attends Religious Services: Not on file  . Active Member of Clubs or Organizations: Not on file  . Attends Banker Meetings: Not on file  .  Marital Status: Not on file     Family History: The patient's ***family history includes Allergic rhinitis in his mother; Cancer in his maternal grandfather; Lung cancer in his maternal uncle. There is no history of Asthma, Eczema, Urticaria, or Angioedema.  ROS:   Please see the history of present illness.    ROS  All other systems reviewed and negative.   EKGs/Labs/Other Studies Reviewed:    The following studies were reviewed today: ***  EKG:  EKG is *** ordered today.  The ekg ordered today demonstrates ***  Recent Labs: 08/10/2019: ALT 41; TSH 1.891 08/11/2019: B Natriuretic Peptide 38.0 08/12/2019: BUN 16; Creatinine, Ser 1.17; Hemoglobin 14.5; Magnesium 2.1; Platelets 235; Potassium 4.3; Sodium 136   Recent Lipid Panel    Component Value Date/Time   CHOL 172 08/11/2019 0441   TRIG 143 08/11/2019 0441   HDL 47 08/11/2019 0441   CHOLHDL 3.7 08/11/2019 0441   VLDL 29 08/11/2019 0441   LDLCALC  96 08/11/2019 0441    Physical Exam:    VS:  There were no vitals taken for this visit.    Wt Readings from Last 3 Encounters:  08/26/19 229 lb (103.9 kg)  08/12/19 (!) 222 lb 11.2 oz (101 kg)  08/21/17 207 lb (93.9 kg)     GEN: *** Well nourished, well developed in no acute distress HEENT: Normal NECK: No JVD; No carotid bruits LYMPHATICS: No lymphadenopathy CARDIAC: ***RRR, no murmurs, rubs, gallops RESPIRATORY:  Clear to auscultation without rales, wheezing or rhonchi  ABDOMEN: Soft, non-tender, non-distended MUSCULOSKELETAL:  No edema; No deformity  SKIN: Warm and dry NEUROLOGIC:  Alert and oriented x 3 PSYCHIATRIC:  Normal affect   ASSESSMENT:    No diagnosis found. PLAN:    In order of problems listed above:  ***   Medication Adjustments/Labs and Tests Ordered: Current medicines are reviewed at length with the patient today.  Concerns regarding medicines are outlined above.  No orders of the defined types were placed in this encounter.  No orders of the defined types were placed in this encounter.   Signed, Armanda Magic, MD  09/18/2019 10:59 PM    [facility]

## 2019-09-19 ENCOUNTER — Ambulatory Visit: Payer: BC Managed Care – PPO | Admitting: Cardiology

## 2019-09-19 ENCOUNTER — Telehealth: Payer: Self-pay

## 2019-09-19 DIAGNOSIS — R072 Precordial pain: Secondary | ICD-10-CM

## 2019-09-19 MED ORDER — METOPROLOL TARTRATE 100 MG PO TABS: ORAL_TABLET | ORAL | 0 refills | Status: DC

## 2019-09-19 NOTE — Telephone Encounter (Signed)
Per Dr. Mayford Knife patient needs a coronary CTA. I have cancelled the patient's appointment for today. We will reschedule for after CTA. I spoke with the patient and he is aware.

## 2019-09-25 NOTE — Telephone Encounter (Signed)
Faxed disability forms to [employer] Attn: Flossie Buffy at [phone]. Called and left message on pt vm to advise that form has been sent -pr

## 2019-10-03 ENCOUNTER — Telehealth (HOSPITAL_COMMUNITY): Payer: Self-pay | Admitting: Emergency Medicine

## 2019-10-03 NOTE — Telephone Encounter (Signed)
Reaching out to patient to offer assistance regarding upcoming cardiac imaging study; pt verbalizes understanding of appt date/time, parking situation and where to check in, pre-test NPO status and medications ordered, and verified current allergies; name and call back number provided for further questions should they arise Timothy Alexandria RN Navigator Cardiac Imaging [facility] [phone] office [phone] cell   Pt with cough, states hes had it for 3 months. I explained that the test requires breath holds for 8 seconds x 4, he said he would try his best to hold his breath for test.  Timothy Murphy

## 2019-10-06 ENCOUNTER — Ambulatory Visit (HOSPITAL_COMMUNITY)
Admission: RE | Admit: 2019-10-06 | Discharge: 2019-10-06 | Disposition: A | Discharge status: Home / Self Care | Payer: BC Managed Care – PPO | Source: Ambulatory Visit | Attending: Cardiology | Admitting: Cardiology

## 2019-10-06 ENCOUNTER — Other Ambulatory Visit: Payer: Self-pay

## 2019-10-06 DIAGNOSIS — R072 Precordial pain: Secondary | ICD-10-CM

## 2019-10-06 MED ORDER — IOHEXOL 350 MG/ML SOLN
80.0000 mL | Freq: Once | INTRAVENOUS | Status: AC | PRN
  Administered 2019-10-06: 80 mL via INTRAVENOUS

## 2019-10-06 MED ORDER — NITROGLYCERIN 0.4 MG SL SUBL
SUBLINGUAL_TABLET | SUBLINGUAL | Status: AC
  Filled 2019-10-06: qty 2

## 2019-10-06 MED ORDER — NITROGLYCERIN 0.4 MG SL SUBL
0.8000 mg | SUBLINGUAL_TABLET | Freq: Once | SUBLINGUAL | Status: AC
  Administered 2019-10-06: 0.8 mg via SUBLINGUAL

## 2019-10-07 ENCOUNTER — Telehealth: Payer: Self-pay | Admitting: Pulmonary Disease

## 2019-10-07 DIAGNOSIS — J984 Other disorders of lung: Secondary | ICD-10-CM

## 2019-10-07 NOTE — Telephone Encounter (Signed)
Patient returned call, patient stated that he is still coughing, it starts when he is talking or exerts himself.  He is using the inhaler and the nebulizer treatments, it improves the cough and sob for 10-15 minutes.  If he exerts himself at all, it starts all over again.  He has used the delsym, it does not help with the cough and he is not coughing anything up, it is a dry cough.  He denies any fever, loss of taste/smell.  States he does have chills at times, but denies any body aches.  Dr. Francine Graven, please advise.  Thank you.

## 2019-10-07 NOTE — Telephone Encounter (Signed)
Spoke with the pt  I notified of recs per Dr Francine Graven  He verbalized understanding  States no marijuana smoking- he thinks had + test while in hospital due to ingesting CBD gummies  He will come in on 10/08/19 for his labs/cxr

## 2019-10-07 NOTE — Telephone Encounter (Signed)
ATC patient, left detailed vm for patient to return call.    Primary Pulmonologist: Dewald Last office visit and with whom: 08/26/2019 Dewald What do we see them for (pulmonary problems): Cough, mild persistent reactive airway disease Was appointment offered to patient (explain)?   Last OV assessment/plan:  Reason for call:  Assessment & Plan:   Timothy Murphy is a 54 year old male with GERD, hiatal hernia, obstructive sleep apnea on CPAP, hypertension and history of childhood asthma who presents with chronic cough.   His cough appears secondary to reactive airways disease after an upper respiratory viral infection. Prior to the infection he reports no issues of cough or with his breathing. CT imaging shows thickened airways indicating inflammation.  PFTs on 09/01/19 show mild restrictive defect with normal DLCO. Differential may include small airways disease such as bronchiolitis.    We will treat him for post viral reactive airways disorder with fluticasone 2 puffs twice daily with a spacer. He can continue duoneb nebulizer treatments as needed every 4-6 hours.   He is to follow up in 2 months. If symptoms persist we can consider performing inflammatory work up based on his recent PFT findings.  Martina Sinner, MD 09/05/2019     Patient Instructions by Martina Sinner, MD at 08/26/2019 2:30 PM Author: Martina Sinner, MD Author Type: Physician Filed: 08/26/2019 2:48 PM  Note Status: Signed Cosign: Cosign Not Required Encounter Date: 08/26/2019  Editor: Martina Sinner, MD (Physician)                 Start flovent 2 puffs twice daily with spacer Continue to use the duoneb nebulizer treatments as needed every 4-6 hours We will check pulmonary function tests and call you with the results.     Instructions    Return in about 2 months (around 10/26/2019). Start flovent 2 puffs twice daily with spacer Continue to use the duoneb nebulizer  treatments as needed every 4-6 hours We will check pulmonary function tests and call you with the results.         After Visit Summary (Printed 08/26/2019) Communications    [facility] Provider CC Chart Rep sent to Clayborn Heron, MD  Sent 09/05/2019   (examples of things to ask: : When did symptoms start? Fever? Cough? Productive? Color to sputum? More sputum than usual? Wheezing? Have you needed increased oxygen? Are you taking your respiratory medications? What over the counter measures have you tried?)  Allergies  Allergen Reactions  . Shellfish Allergy     Abdomen pain     Immunization History  Administered Date(s) Administered  . Influenza Inj Mdck Quad Pf 09/28/2017  . Influenza Split 12/11/2013  . Influenza, Seasonal, Injecte, Preservative Fre 11/07/2014  . PFIZER SARS-COV-2 Vaccination 03/28/2019, 04/21/2019  . Tdap 02/10/2009

## 2019-10-07 NOTE — Telephone Encounter (Signed)
I have placed orders for the following: - Chest radiograph - IgE level - Aspergillus IgE levels - CRP - ESR - ANA - Hypersensitivity Panel - SSA/SSB - Myositis panel -ANCA  This will be for an inflammatory disease work up based on his PFTs which are concerning for a restrictive lung disease. Please check with the patient that he has been abstaining from smoking marijuana.   No further treatment at this time until he has had this lab work completed.  Thanks, Wille Glaser

## 2019-10-08 ENCOUNTER — Other Ambulatory Visit: Payer: Self-pay

## 2019-10-08 ENCOUNTER — Ambulatory Visit (INDEPENDENT_AMBULATORY_CARE_PROVIDER_SITE_OTHER): Payer: BC Managed Care – PPO

## 2019-10-08 ENCOUNTER — Other Ambulatory Visit (INDEPENDENT_AMBULATORY_CARE_PROVIDER_SITE_OTHER): Payer: BC Managed Care – PPO

## 2019-10-08 DIAGNOSIS — J984 Other disorders of lung: Secondary | ICD-10-CM

## 2019-10-08 LAB — C-REACTIVE PROTEIN: CRP: 1 mg/dL (ref 0.5–20.0)

## 2019-10-08 LAB — SEDIMENTATION RATE: Sed Rate: 12 mm/hr (ref 0–20)

## 2019-10-09 ENCOUNTER — Telehealth: Payer: Self-pay | Admitting: Pulmonary Disease

## 2019-10-09 DIAGNOSIS — R059 Cough, unspecified: Secondary | ICD-10-CM

## 2019-10-09 NOTE — Telephone Encounter (Signed)
Called and left message on pt vm advising at patients last visit with Dr. Francine Graven, he was advised that any disability paperwork, would need to be completed by his pcp.   I previously advised him of this when we received the disability papers;however, he then left a message stating that his disability company is requesting something from [place] as well. Paperwork completed stating that pt was seen in our office, what his diagnosis was, and that any disability determination would need to come from his pcp. -pr

## 2019-10-10 ENCOUNTER — Other Ambulatory Visit (INDEPENDENT_AMBULATORY_CARE_PROVIDER_SITE_OTHER): Payer: BC Managed Care – PPO

## 2019-10-10 DIAGNOSIS — R059 Cough, unspecified: Secondary | ICD-10-CM

## 2019-10-10 DIAGNOSIS — R05 Cough: Secondary | ICD-10-CM

## 2019-10-10 LAB — CBC WITH DIFFERENTIAL/PLATELET
Basophils Absolute: 0.1 10*3/uL (ref 0.0–0.1)
Basophils Relative: 1.3 % (ref 0.0–3.0)
Eosinophils Absolute: 0.2 10*3/uL (ref 0.0–0.7)
Eosinophils Relative: 2.3 % (ref 0.0–5.0)
HCT: 44.8 % (ref 39.0–52.0)
Hemoglobin: 14.9 g/dL (ref 13.0–17.0)
Lymphocytes Relative: 19.4 % (ref 12.0–46.0)
Lymphs Abs: 1.7 10*3/uL (ref 0.7–4.0)
MCHC: 33.4 g/dL (ref 30.0–36.0)
MCV: 84.9 fl (ref 78.0–100.0)
Monocytes Absolute: 0.7 10*3/uL (ref 0.1–1.0)
Monocytes Relative: 8.1 % (ref 3.0–12.0)
Neutro Abs: 6 10*3/uL (ref 1.4–7.7)
Neutrophils Relative %: 68.9 % (ref 43.0–77.0)
Platelets: 318 10*3/uL (ref 150.0–400.0)
RBC: 5.27 Mil/uL (ref 4.22–5.81)
RDW: 15.5 % (ref 11.5–15.5)
WBC: 8.7 10*3/uL (ref 4.0–10.5)

## 2019-10-10 LAB — ANA: Anti Nuclear Antibody (ANA): NEGATIVE

## 2019-10-10 LAB — ANCA SCREEN W REFLEX TITER: ANCA Screen: NEGATIVE

## 2019-10-10 LAB — IGE: IgE (Immunoglobulin E), Serum: 211 kU/L — ABNORMAL HIGH (ref <=114)

## 2019-10-10 NOTE — Telephone Encounter (Signed)
ordered a CBC with differential with the other labs that were drawn the other day. It looks like the CBC was not drawn with the other labs. Can you confirm with the lab if a sample needs to be sent, if so, please call the patient and have him come in for this lab draw. Thanks   Spoke with lab to see if we can add CBC to patient's labs unable to add CBC per the lab we can only add with in 24hrs. Spoke with patient regarding CBC that needs to be drawn patient will come into the offcie and do the CBS.Advised patient I will put the order in. Patient's voice was understanding.   FYI:Dr.Dewald

## 2019-10-14 ENCOUNTER — Telehealth: Payer: Self-pay | Admitting: Pulmonary Disease

## 2019-10-14 DIAGNOSIS — J984 Other disorders of lung: Secondary | ICD-10-CM

## 2019-10-14 NOTE — Telephone Encounter (Signed)
The following labs were not drawn at his previous lab draws due to ordering error on my end. I have placed new orders which are listed below: - Aldolase, - Myositis Panel - Aspergillus IgE panel - Aspergillus galactomanan antibody  The patient can have these drawn at his convenience and I thank him for making multiple trips to obtain all the workup necessary to help him.   Please let the patient know that his chest radiograph does not indicate any new changes. His eosinophil cell count is slightly elevated and his IgE level is elevated as well, so I am concerned for an inflammatory or allergic cause to his cough and shortness of breath.   Thanks, Cletis Athens

## 2019-10-14 NOTE — Telephone Encounter (Signed)
lmtcb for pt. Routing to JD's nurse for follow-up, as this was not originated in triage.

## 2019-10-20 ENCOUNTER — Ambulatory Visit (INDEPENDENT_AMBULATORY_CARE_PROVIDER_SITE_OTHER): Payer: BC Managed Care – PPO | Admitting: Pulmonary Disease

## 2019-10-20 ENCOUNTER — Other Ambulatory Visit: Payer: Self-pay

## 2019-10-20 ENCOUNTER — Encounter: Payer: Self-pay | Admitting: Pulmonary Disease

## 2019-10-20 VITALS — BP 124/88 | HR 106 | Temp 97.3°F | Ht 71.0 in | Wt 233.4 lb

## 2019-10-20 DIAGNOSIS — J42 Unspecified chronic bronchitis: Secondary | ICD-10-CM

## 2019-10-20 DIAGNOSIS — J984 Other disorders of lung: Secondary | ICD-10-CM

## 2019-10-20 DIAGNOSIS — K219 Gastro-esophageal reflux disease without esophagitis: Secondary | ICD-10-CM | POA: Diagnosis not present

## 2019-10-20 DIAGNOSIS — R911 Solitary pulmonary nodule: Secondary | ICD-10-CM

## 2019-10-20 MED ORDER — TRELEGY ELLIPTA 100-62.5-25 MCG/INH IN AEPB: 1.0000 | INHALATION_SPRAY | Freq: Every day | RESPIRATORY_TRACT | 6 refills | Status: AC

## 2019-10-20 NOTE — Patient Instructions (Addendum)
Start Trelegy Inhaler, 1 puff daily Stop flovent inhaler Use albuterol inhaler as needed Stop duoneb nebulizer treatments while on the trelegy inhaler We will check labs today and call you with the results

## 2019-10-20 NOTE — Progress Notes (Signed)
Synopsis: Follow up  Subjective:   PATIENT ID: Timothy Murphy, Timothy Murphy   HPI  Chief Complaint  Patient presents with   Follow-up    cough nonproductive shortness of breath and wheezing same as two months ago. nebulizer every day    Timothy Murphy is a 54 year old male with GERD, hiatal hernia, obstructive sleep apnea on CPAP, hypertension and history of childhood asthma who returns with chronic cough.  His PFTs showed mild restrictive defect and was started on flovent 2 puffs twice daily for concern of bronchitis due to air way thickening on his chest imaging. He was previously on steroids and nebulizer treatments without much improvement as well.   He continues to have terrible reflux symptoms. He is taking pepcid and omeprazole. He has also been elevating the head of his bed when sleeping.   He was concerned that his job environment could be affecting his lungs leading to cough as he started a new job at [employer] where he reported increased cough and shortness of breath while at work when exposed to [employer] smoke in the kitchen. He has not been in the work environment and he has had no improvement in his cough.    Past Medical History:  Diagnosis Date   Anxiety    Asthma    as a child   Depression    Hypertension    OSA (obstructive sleep apnea)    OSA on CPAP    Seasonal allergies      Family History  Problem Relation Age of Onset   Cancer Maternal Grandfather        Blood cancer   Allergic rhinitis Mother    Lung cancer Maternal Uncle    Asthma Neg Hx    Eczema Neg Hx    Urticaria Neg Hx    Angioedema Neg Hx      Social History   Socioeconomic History   Marital status: Married    Spouse name: Not on file   Number of children: Not on file   Years of education: Not on file   Highest education level: Not on file  Occupational History   Occupation: Community education officer: Production manager    Tobacco Use   Smoking status: Former Smoker    Packs/day: 1.00    Years: 24.00    Pack years: 24.00    Types: Cigarettes    Start date: 1984    Quit date: 2008    Years since quitting: 13.7   Smokeless tobacco: Never Used  Building services engineer Use: Never used  Substance and Sexual Activity   Alcohol use: Yes    Comment: once a week seasonal   Drug use: Never   Sexual activity: Yes    Partners: Female  Other Topics Concern   Not on file  Social History Narrative   ** Merged History Encounter **       Social Determinants of Health   Financial Resource Strain:    Difficulty of Paying Living Expenses: Not on file  Food Insecurity:    Worried About Programme researcher, broadcasting/film/video in the Last Year: Not on file   [employer] of Food in the Last Year: Not on file  Transportation Needs:    Lack of Transportation (Medical): Not on file   Lack of Transportation (Non-Medical): Not on file  Physical Activity:    Days of Exercise per Week: Not on file  Minutes of Exercise per Session: Not on file  Stress:    Feeling of Stress : Not on file  Social Connections:    Frequency of Communication with Friends and Family: Not on file   Frequency of Social Gatherings with Friends and Family: Not on file   Attends Religious Services: Not on file   Active Member of Clubs or Organizations: Not on file   Attends Banker Meetings: Not on file   Marital Status: Not on file  Intimate Partner Violence:    Fear of Current or Ex-Partner: Not on file   Emotionally Abused: Not on file   Physically Abused: Not on file   Sexually Abused: Not on file     Allergies  Allergen Reactions   Shellfish Allergy     Abdomen pain      Outpatient Medications Prior to Visit  Medication Sig Dispense Refill   albuterol (VENTOLIN HFA) 108 (90 Base) MCG/ACT inhaler Inhale 1-2 puffs into the lungs every 6 (six) hours as needed for wheezing or shortness of breath.     Bioflavonoid  Products (VITAMIN C) CHEW Chew 1 tablet by mouth daily.     clonazePAM (KLONOPIN) 0.5 MG tablet Take 0.25-0.5 mg by mouth 2 (two) times daily as needed for anxiety.     diphenhydrAMINE (BENADRYL) 25 MG tablet Take 25 mg by mouth every 6 (six) hours as needed.     EPINEPHrine (AUVI-Q) 0.3 mg/0.3 mL IJ SOAJ injection Use as directed for severe allergic reaction 2 each 1   lisinopril (PRINIVIL,ZESTRIL) 10 MG tablet Take 10 mg by mouth daily.  3   Melatonin 10 MG CAPS Take by mouth.     metoprolol tartrate (LOPRESSOR) 100 MG tablet Take 1 tablet (100 mg) two hours prior to CT scan 1 tablet 0   Multiple Vitamin (MULTIVITAMIN PO) Take 1 tablet by mouth daily.     Multiple Vitamins-Minerals (ZINC PO) Take 1 tablet by mouth daily.     omeprazole (PRILOSEC) 10 MG capsule Take 10 mg by mouth daily as needed.      sertraline (ZOLOFT) 50 MG tablet Take 100 mg by mouth daily.      Spacer/Aero-Holding Chambers (AEROCHAMBER MV) inhaler Use as instructed 1 each 0   diclofenac (VOLTAREN) 75 MG EC tablet TAKE 1 TABLET BY MOUTH TWICE A DAY 50 tablet 2   fluticasone (FLOVENT HFA) 220 MCG/ACT inhaler Inhale 2 puffs into the lungs in the morning and at bedtime. 1 each 4   ipratropium-albuterol (DUONEB) 0.5-2.5 (3) MG/3ML SOLN Take 3 mLs by nebulization every 6 (six) hours as needed. 360 mL 0   No facility-administered medications prior to visit.    Review of Systems  Constitutional: Negative for chills, diaphoresis, fever, malaise/fatigue and weight loss.  HENT: Negative for congestion, sinus pain and sore throat.   Respiratory: Positive for cough, shortness of breath and wheezing. Negative for hemoptysis and sputum production.   Cardiovascular: Negative for chest pain, palpitations, orthopnea, claudication, leg swelling and PND.  Gastrointestinal: Positive for heartburn. Negative for abdominal pain, blood in stool, constipation, diarrhea, melena, nausea and vomiting.  Genitourinary: Negative for  dysuria and hematuria.  Musculoskeletal: Negative for joint pain and myalgias.  Skin: Negative for itching and rash.  Neurological: Negative for dizziness and weakness.  Endo/Heme/Allergies: Does not bruise/bleed easily.  Psychiatric/Behavioral: Negative.     Objective:   Vitals:   10/20/19 1136  BP: 124/88  Pulse: (!) 106  Temp: (!) 97.3 F (36.3 C)  TempSrc:  Temporal  SpO2: 96%  Weight: 233 lb 6.4 oz (105.9 kg)  Height: 5\' 11"  (1.803 m)   Physical Exam Constitutional:      General: He is not in acute distress.    Appearance: He is obese. He is not ill-appearing.  HENT:     Head: Normocephalic and atraumatic.     Nose: Nose normal.     Mouth/Throat:     Mouth: Mucous membranes are moist.     Pharynx: Oropharynx is clear.  Eyes:     General: No scleral icterus.    Extraocular Movements: Extraocular movements intact.     Conjunctiva/sclera: Conjunctivae normal.     Pupils: Pupils are equal, round, and reactive to light.  Cardiovascular:     Rate and Rhythm: Regular rhythm. Tachycardia present.     Pulses: Normal pulses.     Heart sounds: Normal heart sounds. No murmur heard.   Pulmonary:     Effort: Pulmonary effort is normal.     Breath sounds: Examination of the right-lower field reveals rales. Rales present.     Comments: Cough with deep respirations Abdominal:     General: Bowel sounds are normal.     Palpations: Abdomen is soft.  Musculoskeletal:     Cervical back: Neck supple.     Right lower leg: No edema.     Left lower leg: No edema.  Lymphadenopathy:     Cervical: No cervical adenopathy.  Skin:    General: Skin is warm and dry.  Neurological:     General: No focal deficit present.     Mental Status: He is alert and oriented to person, place, and time. Mental status is at baseline.  Psychiatric:        Mood and Affect: Mood normal.        Behavior: Behavior normal.        Thought Content: Thought content normal.        Judgment: Judgment normal.       CBC    Component Value Date/Time   WBC 8.7 10/10/2019 0941   RBC 5.27 10/10/2019 0941   HGB 14.9 10/10/2019 0941   HCT 44.8 10/10/2019 0941   PLT 318.0 10/10/2019 0941   MCV 84.9 10/10/2019 0941   MCH 27.9 08/12/2019 0413   MCHC 33.4 10/10/2019 0941   RDW 15.5 10/10/2019 0941   LYMPHSABS 1.7 10/10/2019 0941   MONOABS 0.7 10/10/2019 0941   EOSABS 0.2 10/10/2019 0941   BASOSABS 0.1 10/10/2019 0941     Chest imaging: CTA Chest 08/10/19 1. Negative examination for pulmonary embolism. 2. Mild, diffuse bilateral bronchial wall thickening, consistent with nonspecific infectious or inflammatory bronchitis. 3. There is a 5 mm subpleural nodule of the dependent right lower Lobe. 4. Hepatic steatosis.  CT Coronary Scan 10/06/19 Areas of scarring in the lung bases.  8 mm posterior right lower lobe subpleural nodule. Non-contrast chest CT at 6-12 months is recommended. If the nodule is stable at time of repeat CT, then future CT at 18-24 months (from today's scan) is considered optional for low-risk patients, but is recommended for high-risk patients.   PFT: FEV1/FVC 88 FEV1 60% FVC 53% TLC 78% DLCO: 90%  Labs: Myositis Panel: Negative Hypersensitivity Pneumonitis Panel: Negative Aldolase: 6 CRP: 1 IgE: 211 ANA: Negative ANCA: Negative  Echo: 08/11/19 1. Very difficult study. Poor echo windowns. LV function is normal  without WMA.  2. Left ventricular ejection fraction, by estimation, is 60 to 65%. The  left ventricle has normal  function. The left ventricle has no regional  wall motion abnormalities. Left ventricular diastolic parameters were  normal.  3. Right ventricular systolic function is normal. The right ventricular  size is normal.  4. The mitral valve is grossly normal. No evidence of mitral valve  regurgitation. No evidence of mitral stenosis.  5. The aortic valve was not well visualized. Aortic valve regurgitation  is not visualized. No aortic  stenosis is present.     Assessment & Plan:   Chronic bronchitis, unspecified chronic bronchitis type (HCC)  Lung nodule - Plan: CT Chest Wo Contrast, CT Chest High Resolution, CANCELED: CT Chest Wo Contrast  Restrictive lung disease - Plan: Hypersensitivity pnuemonitis profile, Myositis Assessr Plus Jo-1 Autoabs, Aldolase, Aspergillus IgE Panel  Gastroesophageal reflux disease, unspecified whether esophagitis present  Discussion: Timothy HarpsJohn Fiser is a 54 year old male with GERD, hiatal hernia, obstructive sleep apnea on CPAP, hypertension and history of childhood asthma who returns with chronic cough.   He has had minimal response to inhaled steroid therapy. He does have some improvement with nebulizer treatments. We will try trelegy inhaler therapy. His inflammatory workup is unrevealing. His symptoms have not resolved with being away from his work environment for 3 months, so this is not occupational asthma or reactive airways disease related to exposures at work. His PFTs from 09/01/19 show mild restrictive defect which may be related to poor effort during the study as his DLCO is normal.   He continues to have significant reflux symptoms which is likely the underlying etiology to his chronic cough. He reports he has GI workup within the next few weeks. Given his history of hiatal hernia and GERD, he would likely benefit from nissen fundoplication in regards to his pulmonary status.    Follow up in 3 months  Melody ComasJonathan Kaleia Longhi, MD [facility] Office: [phone]     Current Outpatient Medications:    albuterol (VENTOLIN HFA) 108 (90 Base) MCG/ACT inhaler, Inhale 1-2 puffs into the lungs every 6 (six) hours as needed for wheezing or shortness of breath., Disp: , Rfl:    Bioflavonoid Products (VITAMIN C) CHEW, Chew 1 tablet by mouth daily., Disp: , Rfl:    clonazePAM (KLONOPIN) 0.5 MG tablet, Take 0.25-0.5 mg by mouth 2 (two) times daily as needed for anxiety.,  Disp: , Rfl:    diphenhydrAMINE (BENADRYL) 25 MG tablet, Take 25 mg by mouth every 6 (six) hours as needed., Disp: , Rfl:    EPINEPHrine (AUVI-Q) 0.3 mg/0.3 mL IJ SOAJ injection, Use as directed for severe allergic reaction, Disp: 2 each, Rfl: 1   lisinopril (PRINIVIL,ZESTRIL) 10 MG tablet, Take 10 mg by mouth daily., Disp: , Rfl: 3   Melatonin 10 MG CAPS, Take by mouth., Disp: , Rfl:    metoprolol tartrate (LOPRESSOR) 100 MG tablet, Take 1 tablet (100 mg) two hours prior to CT scan, Disp: 1 tablet, Rfl: 0   Multiple Vitamin (MULTIVITAMIN PO), Take 1 tablet by mouth daily., Disp: , Rfl:    Multiple Vitamins-Minerals (ZINC PO), Take 1 tablet by mouth daily., Disp: , Rfl:    omeprazole (PRILOSEC) 10 MG capsule, Take 10 mg by mouth daily as needed. , Disp: , Rfl:    sertraline (ZOLOFT) 50 MG tablet, Take 100 mg by mouth daily. , Disp: , Rfl:    Spacer/Aero-Holding Chambers (AEROCHAMBER MV) inhaler, Use as instructed, Disp: 1 each, Rfl: 0   Fluticasone-Umeclidin-Vilant (TRELEGY ELLIPTA) 100-62.5-25 MCG/INH AEPB, Inhale 1 puff into the lungs daily., Disp: 1  each, Rfl: 6   ipratropium-albuterol (DUONEB) 0.5-2.5 (3) MG/3ML SOLN, Take 3 mLs by nebulization every 6 (six) hours as needed., Disp: 360 mL, Rfl: 0

## 2019-10-23 ENCOUNTER — Telehealth: Payer: Self-pay | Admitting: Pulmonary Disease

## 2019-10-23 NOTE — Telephone Encounter (Signed)
Called and spoke with pt. Pt is requesting to have a note for work. Pt is requesting to be out of work for at least a month until they figure out what is going on why he has this cough that will not go away.   Dr. Francine Graven, please advise if you are okay with this being done and if so, please advise what we need to say in the note.

## 2019-10-28 ENCOUNTER — Telehealth: Payer: Self-pay | Admitting: Cardiology

## 2019-10-28 LAB — ASPERGILLUS IGE PANEL
A. Amstel/Glaucu Class Interp: 0
A. Flavus Class Interp: 0
A. Nidulans Class Interp: 0
A. Niger Class Interp: 0
A. Versicolor Class Interp: 0
Aspergillus amstel/glaucu IgE*: <0.35 kU/L (ref <0.35)
Aspergillus flavus IgE: <0.35 kU/L (ref <0.35)
Aspergillus fumigatus IgE: 0.22 kU/L (ref <0.35)
Aspergillus nidulans IgE: <0.35 kU/L (ref <0.35)
Aspergillus niger IgE: <0.1 kU/L (ref <0.35)
Aspergillus versicolor IgE: <0.1 kU/L (ref <0.35)

## 2019-10-28 LAB — MYOSITIS ASSESSR PLUS JO-1 AUTOABS
EJ Autoabs: NOT DETECTED
Jo-1 Autoabs: <1 AI
Ku Autoabs: NOT DETECTED
Mi-2 Autoabs: NOT DETECTED
OJ Autoabs: NOT DETECTED
PL-12 Autoabs: NOT DETECTED
PL-7 Autoabs: NOT DETECTED
SRP Autoabs: NOT DETECTED

## 2019-10-28 LAB — HYPERSENSITIVITY PNUEMONITIS PROFILE
ASPERGILLUS FUMIGATUS: NEGATIVE
Faenia retivirgula: NEGATIVE
Pigeon Serum: NEGATIVE
S. VIRIDIS: NEGATIVE
T. CANDIDUS: NEGATIVE
T. VULGARIS: NEGATIVE

## 2019-10-28 LAB — ALDOLASE: Aldolase: 6 U/L (ref <=8.1)

## 2019-10-28 NOTE — Telephone Encounter (Signed)
[facility] received Short Term Disability paperwork from [employer]. The patient has never seen Dr. Mayford Knife in the office but saw him in the hospital. Placing paperwork in her box. 10/28/19  KLM

## 2019-10-29 NOTE — Telephone Encounter (Signed)
Please inform the patient that his inflammatory and hypersensitivity workup has come back negative. I believe his cough is mainly coming from severe reflux disease but we can try the following below to make sure it is not reactive airways disease.   If he is open to the idea of trying a prolonged steroid taper to help with the cough, we can try this to see if he has any benefit. The taper would be prednisone 40mg  daily for 4 days, then 30mg  daily for 4 days, then 20mg  for 4 days, 10mg  for 4 days, then 5mg  for 4 days and done. Please send in the script for this if he would like to pursue this.   You can write a work notice for 3 more weeks off due to a respiratory condition that hinders him from being able to complete a full day of work. After that, he should be able to return to work as he does not have oxygen desaturations when ambulating.    Thanks, 

## 2019-10-30 ENCOUNTER — Encounter: Payer: Self-pay | Admitting: *Deleted

## 2019-10-30 NOTE — Telephone Encounter (Signed)
Spoke with the pt and notified of recs per Dr Francine Graven  He verbalized understanding  Would like to hold off on prednisone bc he states that the steroids have never helped him  I created his work note and uploaded to his mychart per his request

## 2019-10-31 ENCOUNTER — Telehealth: Payer: Self-pay | Admitting: Pulmonary Disease

## 2019-10-31 ENCOUNTER — Ambulatory Visit (INDEPENDENT_AMBULATORY_CARE_PROVIDER_SITE_OTHER)
Admission: RE | Admit: 2019-10-31 | Discharge: 2019-10-31 | Disposition: A | Discharge status: Home / Self Care | Payer: BC Managed Care – PPO | Source: Ambulatory Visit | Attending: Pulmonary Disease | Admitting: Pulmonary Disease

## 2019-10-31 ENCOUNTER — Other Ambulatory Visit: Payer: Self-pay

## 2019-10-31 DIAGNOSIS — R911 Solitary pulmonary nodule: Secondary | ICD-10-CM

## 2019-10-31 NOTE — Telephone Encounter (Signed)
Rec'd a restrictions form via interoffice mail from medical records. Dr. Francine Graven has taken the patient out of work for 3 weeks, updated form and will give to Dr. Francine Graven to sign on 11/03/2019 when he returns to the office. -pr

## 2019-11-03 ENCOUNTER — Ambulatory Visit: Payer: BC Managed Care – PPO | Admitting: Pulmonary Disease

## 2019-11-03 NOTE — Telephone Encounter (Signed)
Please refer back to the phone conversation on 10/7. His lab work is unremarkable and his CT chest scan is unremarkable. His cough and shortness of breath are likely from his severe GERD which should be evaluated by GI and be considered for possible surgical intervention.   Nothing further to be done from a pulmonary standpoint.

## 2019-11-03 NOTE — Telephone Encounter (Signed)
Patient had labs on 10/20/2019.  Patient is wanting to know if there are any other labs that can been drawn. He is anxious to know what is causing his sx.  He reports of some relief in sx since starting trelegy, however cough and sob is still present. Cough is non prod.   Dr. Francine Graven, please advise. Thanks

## 2019-11-03 NOTE — Telephone Encounter (Signed)
Patient is aware of below message and voiced his understanding.  Nothing further needed.   

## 2019-11-04 NOTE — Telephone Encounter (Signed)
Rec'd signed form back from Dr. Francine Graven. Faxed to [employer] at [phone] -pr

## 2019-11-06 ENCOUNTER — Other Ambulatory Visit: Payer: Self-pay | Admitting: Gastroenterology

## 2019-11-24 ENCOUNTER — Telehealth: Payer: Self-pay | Admitting: Pulmonary Disease

## 2019-11-24 NOTE — Telephone Encounter (Signed)
Rec'd form via fax from [facility] - Prepared paperwork for Dr. Francine Graven -pr

## 2019-11-25 ENCOUNTER — Other Ambulatory Visit: Payer: BC Managed Care – PPO

## 2019-11-25 DIAGNOSIS — Z20822 Contact with and (suspected) exposure to covid-19: Secondary | ICD-10-CM

## 2019-11-25 NOTE — Telephone Encounter (Signed)
Rec'd sign form back from Dr. Francine Graven. Faxed to [employer] at [phone]. -pr

## 2019-11-26 LAB — SARS-COV-2, NAA 2 DAY TAT

## 2019-11-26 LAB — NOVEL CORONAVIRUS, NAA: SARS-CoV-2, NAA: NOT DETECTED

## 2019-12-08 ENCOUNTER — Ambulatory Visit: Payer: BC Managed Care – PPO | Admitting: Allergy and Immunology

## 2019-12-08 ENCOUNTER — Ambulatory Visit: Payer: Self-pay | Admitting: Allergy

## 2019-12-16 ENCOUNTER — Other Ambulatory Visit (HOSPITAL_COMMUNITY)
Admission: RE | Admit: 2019-12-16 | Discharge: 2019-12-16 | Disposition: A | Discharge status: Home / Self Care | Payer: BC Managed Care – PPO | Source: Ambulatory Visit | Attending: Gastroenterology | Admitting: Gastroenterology

## 2019-12-16 DIAGNOSIS — Z01812 Encounter for preprocedural laboratory examination: Secondary | ICD-10-CM | POA: Diagnosis present

## 2019-12-16 DIAGNOSIS — Z20822 Contact with and (suspected) exposure to covid-19: Secondary | ICD-10-CM | POA: Diagnosis not present

## 2019-12-16 LAB — SARS CORONAVIRUS 2 (TAT 6-24 HRS): SARS Coronavirus 2: NEGATIVE

## 2019-12-17 ENCOUNTER — Encounter (HOSPITAL_COMMUNITY)
Admission: RE | Disposition: A | Discharge status: Home / Self Care | Payer: Self-pay | Source: Home / Self Care | Attending: Gastroenterology

## 2019-12-17 ENCOUNTER — Ambulatory Visit (HOSPITAL_COMMUNITY)
Admission: RE | Admit: 2019-12-17 | Discharge: 2019-12-17 | Disposition: A | Discharge status: Home / Self Care | Payer: BC Managed Care – PPO | Attending: Gastroenterology | Admitting: Gastroenterology

## 2019-12-17 DIAGNOSIS — R059 Cough, unspecified: Secondary | ICD-10-CM | POA: Diagnosis not present

## 2019-12-17 DIAGNOSIS — K219 Gastro-esophageal reflux disease without esophagitis: Secondary | ICD-10-CM | POA: Diagnosis not present

## 2019-12-17 HISTORY — PX: PH IMPEDANCE STUDY: SHX5565

## 2019-12-17 HISTORY — PX: ESOPHAGEAL MANOMETRY: SHX5429

## 2019-12-17 SURGERY — MANOMETRY, ESOPHAGUS

## 2019-12-17 MED ORDER — LIDOCAINE VISCOUS HCL 2 % MT SOLN
OROMUCOSAL | Status: AC
  Filled 2019-12-17: qty 15

## 2019-12-17 SURGICAL SUPPLY — 2 items
FACESHIELD LNG OPTICON STERILE (SAFETY) IMPLANT
GLOVE BIO SURGEON STRL SZ8 (GLOVE) ×4 IMPLANT

## 2019-12-17 NOTE — Progress Notes (Signed)
Esophageal manometry performed per protocol.  Patient tolerated procedure without any diffuculties.  PH probe then placed at 41 cm at right nare.  Written and verbal education provided on use of equipment and when to return to Endoscopy to have probe removed.  Patient verbalized understanding of all instructions.  Report to be sent to Dr.Schooler,Vincent MD

## 2019-12-18 ENCOUNTER — Encounter (HOSPITAL_COMMUNITY): Payer: Self-pay | Admitting: Gastroenterology

## 2020-03-03 ENCOUNTER — Telehealth: Payer: BC Managed Care – PPO | Admitting: Physician Assistant

## 2020-03-03 DIAGNOSIS — R197 Diarrhea, unspecified: Secondary | ICD-10-CM

## 2020-03-03 DIAGNOSIS — R1110 Vomiting, unspecified: Secondary | ICD-10-CM

## 2020-03-03 MED ORDER — ONDANSETRON 4 MG PO TBDP: ORAL_TABLET | ORAL | 0 refills | Status: DC

## 2020-03-03 NOTE — Progress Notes (Signed)
We are sorry that you are not feeling well. Here is how we plan to help!  Based on what you have shared with me it looks like you have a Virus that is irritating your GI tract.  Vomiting is the forceful emptying of a portion of the stomach's content through the mouth.  Although nausea and vomiting can make you feel miserable, it's important to remember that these are not diseases, but rather symptoms of an underlying illness.  When we treat short term symptoms, we always caution that any symptoms that persist should be fully evaluated in a medical office.  I have prescribed a medication that will help alleviate your symptoms and allow you to stay hydrated:  Zofran 4 mg 1 tablet every 8 hours as needed for nausea and vomiting  HOME CARE: Drink clear liquids.  This is very important! Dehydration (the lack of fluid) can lead to a serious complication.  Start off with 1 tablespoon every 5 minutes for 8 hours. You may begin eating bland foods after 8 hours without vomiting.  Start with saltine crackers, white bread, rice, mashed potatoes, applesauce. After 48 hours on a bland diet, you may resume a normal diet. Try to go to sleep.  Sleep often empties the stomach and relieves the need to vomit.  GET HELP RIGHT AWAY IF:  Your symptoms do not improve or worsen within 2 days after treatment. You have a fever for over 3 days. You cannot keep down fluids after trying the medication.  MAKE SURE YOU:  Understand these instructions. Will watch your condition. Will get help right away if you are not doing well or get worse.   Thank you for choosing an e-visit. Your e-visit answers were reviewed by a board certified advanced clinical practitioner to complete your personal care plan. Depending upon the condition, your plan could have included both over the counter or prescription medications. Please review your pharmacy choice. Be sure that the pharmacy you have chosen is open so that you can pick up  your prescription now.  If there is a problem you may message your provider in MyChart to have the prescription routed to another pharmacy. Your safety is important to us. If you have drug allergies check your prescription carefully.  For the next 24 hours, you can use MyChart to ask questions about today's visit, request a non-urgent call back, or ask for a work or school excuse from your e-visit provider. You will get an e-mail in the next two days asking about your experience. I hope that your e-visit has been valuable and will speed your recovery.  Greater than 5 minutes, yet less than 10 minutes of time have been spent researching, coordinating, and implementing care for this patient today  

## 2020-03-16 DIAGNOSIS — J301 Allergic rhinitis due to pollen: Secondary | ICD-10-CM | POA: Diagnosis not present

## 2020-03-16 NOTE — Progress Notes (Signed)
VIALS EXP 03-16-21 

## 2020-03-19 ENCOUNTER — Telehealth: Payer: Self-pay | Admitting: Allergy

## 2020-03-19 NOTE — Telephone Encounter (Signed)
Left voicemail to schedule OV for insurance purposes regarding allergy injections.

## 2020-10-26 ENCOUNTER — Other Ambulatory Visit: Payer: Self-pay | Admitting: Pulmonary Disease

## 2020-11-25 ENCOUNTER — Other Ambulatory Visit: Payer: Self-pay | Admitting: Physician Assistant

## 2020-11-25 ENCOUNTER — Ambulatory Visit
Admission: RE | Admit: 2020-11-25 | Discharge: 2020-11-25 | Disposition: A | Discharge status: Home / Self Care | Payer: BC Managed Care – PPO | Source: Ambulatory Visit | Attending: Physician Assistant | Admitting: Physician Assistant

## 2020-11-25 DIAGNOSIS — R059 Cough, unspecified: Secondary | ICD-10-CM

## 2021-08-03 ENCOUNTER — Encounter: Payer: Self-pay | Admitting: Neurology

## 2021-09-09 NOTE — Progress Notes (Deleted)
Assessment/Plan:    ***  Subjective:   Timothy Murphy was seen today in the movement disorders clinic for neurologic consultation at the request of Irven Coe, MD.  The consultation is for the evaluation of Parkinsons disease.  Medical records indicate patient has history of some type of parasomnia, and patient wanted to discuss relationship to possible Parkinson's disease.   Specific Symptoms:  Tremor: {yes no:314532} Family hx of similar:  {yes no:314532} Voice: *** Sleep: ***  Vivid Dreams:  {yes no:314532}  Acting out dreams:  {yes no:314532} Wet Pillows: {yes no:314532} Postural symptoms:  {yes no:314532}  Falls?  {yes no:314532} Bradykinesia symptoms: {parkinson brady:18041} Loss of smell:  {yes no:314532} Loss of taste:  {yes no:314532} Urinary Incontinence:  {yes no:314532} Difficulty Swallowing:  {yes no:314532} Handwriting, micrographia: {yes no:314532} Trouble with ADL's:  {yes no:314532}  Trouble buttoning clothing: {yes no:314532} Depression:  {yes no:314532} Memory changes:  {yes no:314532} Hallucinations:  {yes no:314532}  visual distortions: {yes no:314532} N/V:  {yes no:314532} Lightheaded:  {yes no:314532}  Syncope: {yes no:314532} Diplopia:  {yes no:314532} Dyskinesia:  {yes no:314532} Prior exposure to reglan/antipsychotics: {yes no:314532}  Neuroimaging of the brain has not previously been performed.  It *** available for my review today.  PREVIOUS MEDICATIONS: {Parkinson's RX:18200}  ALLERGIES:   Allergies  Allergen Reactions   Shellfish Allergy     Abdomen pain     CURRENT MEDICATIONS:  No outpatient medications have been marked as taking for the 09/13/21 encounter (Appointment) with Micholas Drumwright, Octaviano Batty, DO.     Objective:   VITALS:  There were no vitals filed for this visit.  GEN:  The patient appears stated age and is in NAD. HEENT:  Normocephalic, atraumatic.  The mucous membranes are moist. The superficial temporal arteries are  without ropiness or tenderness. CV:  RRR Lungs:  CTAB Neck/HEME:  There are no carotid bruits bilaterally.  Neurological examination:  Orientation: The patient is alert and oriented x3.  Cranial nerves: There is good facial symmetry. Extraocular muscles are intact. The visual fields are full to confrontational testing. The speech is fluent and clear. Soft palate rises symmetrically and there is no tongue deviation. Hearing is intact to conversational tone. Sensation: Sensation is intact to light and pinprick throughout (facial, trunk, extremities). Vibration is intact at the bilateral big toe. There is no extinction with double simultaneous stimulation. There is no sensory dermatomal level identified. Motor: Strength is 5/5 in the bilateral upper and lower extremities.   Shoulder shrug is equal and symmetric.  There is no pronator drift. Deep tendon reflexes: Deep tendon reflexes are 2/4 at the bilateral biceps, triceps, brachioradialis, patella and achilles. Plantar responses are downgoing bilaterally.  Movement examination: Tone: There is ***tone in the bilateral upper extremities.  The tone in the lower extremities is ***.  Abnormal movements: *** Coordination:  There is *** decremation with RAM's, *** Gait and Station: The patient has *** difficulty arising out of a deep-seated chair without the use of the hands. The patient's stride length is ***.  The patient has a *** pull test.     I have reviewed and interpreted the following labs independently   Chemistry      Component Value Date/Time   NA 136 08/12/2019 0413   K 4.3 08/12/2019 0413   CL 102 08/12/2019 0413   CO2 25 08/12/2019 0413   BUN 16 08/12/2019 0413   CREATININE 1.17 08/12/2019 0413      Component Value Date/Time   CALCIUM 9.1  08/12/2019 0413   ALKPHOS 129 (H) 08/10/2019 1031   AST 33 08/10/2019 1031   ALT 41 08/10/2019 1031   BILITOT 0.9 08/10/2019 1031      Lab Results  Component Value Date   TSH 1.891  08/10/2019   Lab Results  Component Value Date   WBC 8.7 10/10/2019   HGB 14.9 10/10/2019   HCT 44.8 10/10/2019   MCV 84.9 10/10/2019   PLT 318.0 10/10/2019     Total time spent on today's visit was ***60 minutes, including both face-to-face time and nonface-to-face time.  Time included that spent on review of records (prior notes available to me/labs/imaging if pertinent), discussing treatment and goals, answering patient's questions and coordinating care.  Cc:  Rankins, Fanny Dance, MD

## 2021-09-13 ENCOUNTER — Ambulatory Visit: Payer: BC Managed Care – PPO | Admitting: Neurology
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
## 2021-10-24 ENCOUNTER — Ambulatory Visit: Payer: BC Managed Care – PPO | Admitting: Neurology

## 2021-10-24 ENCOUNTER — Encounter: Payer: Self-pay | Admitting: Neurology

## 2021-10-24 ENCOUNTER — Other Ambulatory Visit: Payer: Self-pay

## 2021-10-24 VITALS — BP 119/73 | HR 75 | Ht 71.0 in | Wt 218.2 lb

## 2021-10-24 DIAGNOSIS — R440 Auditory hallucinations: Secondary | ICD-10-CM | POA: Diagnosis not present

## 2021-10-24 DIAGNOSIS — G4752 REM sleep behavior disorder: Secondary | ICD-10-CM

## 2021-10-24 DIAGNOSIS — R2681 Unsteadiness on feet: Secondary | ICD-10-CM

## 2021-10-24 DIAGNOSIS — R32 Unspecified urinary incontinence: Secondary | ICD-10-CM

## 2021-10-24 NOTE — Patient Instructions (Signed)
A referral to [facility] has been placed for your MRI someone will contact you directly to schedule your appt. They are located at [place]. Please contact them directly by calling [phone] with any questions regarding your referral.  As I mentioned, you need to follow up with your PCP regarding bladder incontinence and auditory hallucinations and the other symptoms you mentioned.  The physicians and staff at [facility] are committed to providing excellent care. You may receive a survey requesting feedback about your experience at our office. We strive to receive "very good" responses to the survey questions. If you feel that your experience would prevent you from giving the office a "very good " response, please contact our office to try to remedy the situation. We may be reached at [phone]. Thank you for taking the time out of your busy day to complete the survey.

## 2021-11-10 ENCOUNTER — Ambulatory Visit
Admission: RE | Admit: 2021-11-10 | Discharge: 2021-11-10 | Disposition: A | Discharge status: Home / Self Care | Payer: BC Managed Care – PPO | Source: Ambulatory Visit | Attending: Neurology | Admitting: Neurology

## 2021-11-10 DIAGNOSIS — R2681 Unsteadiness on feet: Secondary | ICD-10-CM

## 2021-11-10 DIAGNOSIS — R440 Auditory hallucinations: Secondary | ICD-10-CM

## 2022-01-25 ENCOUNTER — Other Ambulatory Visit: Payer: Self-pay | Admitting: Family Medicine

## 2022-01-25 DIAGNOSIS — R911 Solitary pulmonary nodule: Secondary | ICD-10-CM

## 2022-02-02 ENCOUNTER — Ambulatory Visit
Admission: RE | Admit: 2022-02-02 | Discharge: 2022-02-02 | Disposition: A | Discharge status: Home / Self Care | Payer: BC Managed Care – PPO | Source: Ambulatory Visit | Attending: Family Medicine | Admitting: Family Medicine

## 2022-02-02 DIAGNOSIS — R911 Solitary pulmonary nodule: Secondary | ICD-10-CM

## 2022-03-14 ENCOUNTER — Other Ambulatory Visit: Payer: Self-pay | Admitting: Pulmonary Disease

## 2022-03-14 ENCOUNTER — Encounter: Payer: Self-pay | Admitting: Pulmonary Disease

## 2022-03-14 ENCOUNTER — Ambulatory Visit: Payer: BC Managed Care – PPO | Admitting: Pulmonary Disease

## 2022-03-14 VITALS — BP 130/70 | HR 92 | Ht 71.0 in | Wt 231.0 lb

## 2022-03-14 DIAGNOSIS — K219 Gastro-esophageal reflux disease without esophagitis: Secondary | ICD-10-CM

## 2022-03-14 DIAGNOSIS — R0982 Postnasal drip: Secondary | ICD-10-CM

## 2022-03-14 DIAGNOSIS — G4733 Obstructive sleep apnea (adult) (pediatric): Secondary | ICD-10-CM

## 2022-03-14 DIAGNOSIS — J4520 Mild intermittent asthma, uncomplicated: Secondary | ICD-10-CM

## 2022-03-14 MED ORDER — FLUTICASONE PROPIONATE 50 MCG/ACT NA SUSP: 2.0000 | Freq: Every day | NASAL | 2 refills | Status: AC

## 2022-03-14 MED ORDER — IPRATROPIUM BROMIDE 0.03 % NA SOLN: 2.0000 | Freq: Two times a day (BID) | NASAL | 12 refills | Status: AC

## 2022-03-14 MED ORDER — PREDNISONE 10 MG PO TABS: ORAL_TABLET | ORAL | 0 refills | Status: AC

## 2022-03-14 NOTE — Patient Instructions (Addendum)
Continue trelegy ellipta 1 puff daily - rinse mouth out after each use  Start fluticasone nasal spray, 2 sprays per nostril daily  Start ipratropium nasal spray, 2 sprays per nostril twice daily  We will check pulmonary function tests within the next month  We will check labs today  Start prednisone taper  Follow up in 6 weeks

## 2022-03-14 NOTE — Progress Notes (Signed)
Synopsis: Follow up  Subjective:   PATIENT ID: Timothy Murphy GENDER: male DOB: April 13, 1965, MRN: [id number]  HPI  Chief Complaint  Patient presents with   Follow-up    Overdue F/U for SOB and chest tightness. States the Trelegy only works for [employer]. Increase in productive cough.    Timothy Murphy is a 57 year old male with GERD, hiatal hernia, obstructive sleep apnea on CPAP, hypertension and history of childhood asthma who returns with chronic cough and shortness of breath.  He had Nissen fundoplication 05/05/2021 with significant improvement in his GERD.  He continues to have shortness of breath and cough with certain exposures like car exhaust and smoke exposure.  He has intermittent wheezing. He is using a humidifier in his car and has updated car air filter.   He is using trelegy ellipta 1 puff daily and feels like it only works for 4-6 hours. He has significant sinus congestion and post-nasal drainage.   OV 10/20/19 His PFTs showed mild restrictive defect and was started on flovent 2 puffs twice daily for concern of bronchitis due to air way thickening on his chest imaging. He was previously on steroids and nebulizer treatments without much improvement as well.   He continues to have terrible reflux symptoms. He is taking pepcid and omeprazole. He has also been elevating the head of his bed when sleeping.   He was concerned that his job environment could be affecting his lungs leading to cough as he started a new job at [employer] where he reported increased cough and shortness of breath while at work when exposed to [employer] smoke in the kitchen. He has not been in the work environment and he has had no improvement in his cough.    Past Medical History:  Diagnosis Date   Anxiety    Asthma    as a child   Depression    Hypertension    OSA (obstructive sleep apnea)    OSA on CPAP    wears cpap about 60% of time   Seasonal allergies      Family History  Problem  Relation Age of Onset   Allergic rhinitis Mother    Sleep disorder Mother    Sleep disorder Sister    Lung cancer Maternal Uncle    Sleep disorder Maternal Grandmother    Cancer Maternal Grandfather        Blood cancer   Asthma Neg Hx    Eczema Neg Hx    Urticaria Neg Hx    Angioedema Neg Hx      Social History   Socioeconomic History   Marital status: Married    Spouse name: Not on file   Number of children: Not on file   Years of education: Not on file   Highest education level: Not on file  Occupational History   Occupation: Lobbyist: [employer]  Tobacco Use   Smoking status: Former    Packs/day: 1.00    Years: 24.00    Total pack years: 24.00    Types: Cigarettes    Start date: 1984    Quit date: 2008    Years since quitting: 16.1   Smokeless tobacco: Never  Vaping Use   Vaping Use: Never used  Substance and Sexual Activity   Alcohol use: Yes    Comment: once a week seasonal   Drug use: Never   Sexual activity: Yes    Partners: Female  Other Topics Concern  Not on file  Social History Narrative   ** Merged History Encounter **    Left handed   Scientist, product/process development    Social Determinants of Health   Financial Resource Strain: Not on file  Food Insecurity: Not on file  Transportation Needs: Not on file  Physical Activity: Not on file  Stress: Not on file  Social Connections: Not on file  Intimate Partner Violence: Not on file     Allergies  Allergen Reactions   Shellfish Allergy     Abdomen pain      Outpatient Medications Prior to Visit  Medication Sig Dispense Refill   albuterol (VENTOLIN HFA) 108 (90 Base) MCG/ACT inhaler Inhale 1-2 puffs into the lungs every 6 (six) hours as needed for wheezing or shortness of breath.     diphenhydrAMINE (BENADRYL) 25 MG tablet Take 25 mg by mouth every 6 (six) hours as needed.     EFFEXOR XR 75 MG 24 hr capsule Take 75 mg by mouth daily with breakfast.     EPINEPHrine (AUVI-Q) 0.3 mg/0.3 mL IJ  SOAJ injection Use as directed for severe allergic reaction 2 each 1   Fluticasone-Umeclidin-Vilant (TRELEGY ELLIPTA) 100-62.5-25 MCG/INH AEPB Inhale 1 puff into the lungs daily. 1 each 6   Melatonin 10 MG CAPS Take by mouth.     Multiple Vitamin (MULTIVITAMIN PO) Take 1 tablet by mouth daily.     prazosin (MINIPRESS) 1 MG capsule Take by mouth.     sertraline (ZOLOFT) 50 MG tablet Take 100 mg by mouth daily.      Spacer/Aero-Holding Chambers (AEROCHAMBER MV) inhaler Use as instructed 1 each 0   traZODone (DESYREL) 50 MG tablet Take by mouth.     Vitamin D, Ergocalciferol, (DRISDOL) 1.25 MG (50000 UNIT) CAPS capsule Take 50,000 Units by mouth once a week.     ipratropium-albuterol (DUONEB) 0.5-2.5 (3) MG/3ML SOLN Take 3 mLs by nebulization every 6 (six) hours as needed. 360 mL 0   Bioflavonoid Products (VITAMIN C) CHEW Chew 1 tablet by mouth daily.     clonazePAM (KLONOPIN) 0.5 MG tablet Take 0.25-0.5 mg by mouth 2 (two) times daily as needed for anxiety.     Multiple Vitamins-Minerals (ZINC PO) Take 1 tablet by mouth daily.     ondansetron (ZOFRAN ODT) 4 MG disintegrating tablet 4mg  ODT q4 hours prn nausea/vomit 10 tablet 0   No facility-administered medications prior to visit.    Review of Systems  Constitutional:  Negative for chills, diaphoresis, fever, malaise/fatigue and weight loss.  HENT:  Negative for congestion, sinus pain and sore throat.   Respiratory:  Positive for cough, shortness of breath and wheezing. Negative for hemoptysis and sputum production.   Cardiovascular:  Negative for chest pain, palpitations, orthopnea, claudication, leg swelling and PND.  Gastrointestinal:  Negative for abdominal pain, blood in stool, constipation, diarrhea, heartburn, melena, nausea and vomiting.  Genitourinary:  Negative for dysuria and hematuria.  Musculoskeletal:  Negative for joint pain and myalgias.  Skin:  Negative for itching and rash.  Neurological:  Negative for dizziness and  weakness.  Endo/Heme/Allergies:  Does not bruise/bleed easily.  Psychiatric/Behavioral: Negative.      Objective:   Vitals:   03/14/22 0836  BP: 130/70  Pulse: 92  SpO2: 97%  Weight: 231 lb (104.8 kg)  Height: 5\' 11"  (1.803 m)   Physical Exam Constitutional:      General: He is not in acute distress.    Appearance: He is obese. He is not ill-appearing.  HENT:     Head: Normocephalic and atraumatic.  Eyes:     General: No scleral icterus.    Conjunctiva/sclera: Conjunctivae normal.  Cardiovascular:     Rate and Rhythm: Normal rate and regular rhythm.     Pulses: Normal pulses.     Heart sounds: Normal heart sounds. No murmur heard. Pulmonary:     Effort: Pulmonary effort is normal.     Breath sounds: Normal breath sounds.  Musculoskeletal:     Right lower leg: No edema.     Left lower leg: No edema.  Skin:    General: Skin is warm and dry.  Neurological:     General: No focal deficit present.     Mental Status: He is alert and oriented to person, place, and time. Mental status is at baseline.     CBC    Component Value Date/Time   WBC 8.7 10/10/2019 0941   RBC 5.27 10/10/2019 0941   HGB 14.9 10/10/2019 0941   HCT 44.8 10/10/2019 0941   PLT 318.0 10/10/2019 0941   MCV 84.9 10/10/2019 0941   MCH 27.9 08/12/2019 0413   MCHC 33.4 10/10/2019 0941   RDW 15.5 10/10/2019 0941   LYMPHSABS 1.7 10/10/2019 0941   MONOABS 0.7 10/10/2019 0941   EOSABS 0.2 10/10/2019 0941   BASOSABS 0.1 10/10/2019 0941     Chest imaging: CT Chest 02/02/22 1. No acute cardiopulmonary disease. 2. Stable bilateral pulmonary nodules with the largest measuring 6 mm over the right lower lobe. These have been stable since 2021. No further imaging follow-up recommended. 3. 3.7 cm simple right renal cyst. 4. Two subcentimeter hypodensities over the dome of the liver not well seen previously. These may represent small cysts or hemangiomas.  CTA Chest 08/10/19 1. Negative examination for  pulmonary embolism. 2. Mild, diffuse bilateral bronchial wall thickening, consistent with nonspecific infectious or inflammatory bronchitis. 3. There is a 5 mm subpleural nodule of the dependent right lower Lobe. 4. Hepatic steatosis.  CT Coronary Scan 10/06/19 Areas of scarring in the lung bases.   8 mm posterior right lower lobe subpleural nodule. Non-contrast chest CT at 6-12 months is recommended. If the nodule is stable at time of repeat CT, then future CT at 18-24 months (from today's scan) is considered optional for low-risk patients, but is recommended for high-risk patients.   PFT: FEV1/FVC 88 FEV1 60% FVC 53% TLC 78% DLCO: 90%  Labs: Myositis Panel: Negative Hypersensitivity Pneumonitis Panel: Negative Aldolase: 6 CRP: 1 IgE: 211 ANA: Negative ANCA: Negative  Echo: 08/11/19  1. Very difficult study. Poor echo windowns. LV function is normal  without WMA.   2. Left ventricular ejection fraction, by estimation, is 60 to 65%. The  left ventricle has normal function. The left ventricle has no regional  wall motion abnormalities. Left ventricular diastolic parameters were  normal.   3. Right ventricular systolic function is normal. The right ventricular  size is normal.   4. The mitral valve is grossly normal. No evidence of mitral valve  regurgitation. No evidence of mitral stenosis.   5. The aortic valve was not well visualized. Aortic valve regurgitation  is not visualized. No aortic stenosis is present.     Assessment & Plan:   Mild intermittent asthmatic bronchitis without complication - Plan: Pulmonary Function Test, predniSONE (DELTASONE) 10 MG tablet, CBC w/Diff, IgE, CANCELED: CBC with Differential, CANCELED: IgE  OSA on CPAP  Gastroesophageal reflux disease without esophagitis  Post-nasal drainage - Plan: fluticasone (FLONASE) 50 MCG/ACT  nasal spray, ipratropium (ATROVENT) 0.03 % nasal spray, predniSONE (DELTASONE) 10 MG tablet  Discussion: Timothy Murphy is a 57 year old male with GERD, hiatal hernia, obstructive sleep apnea on CPAP, hypertension and history of childhood asthma who returns with chronic cough.    He is to continue trelegy 100, 1 puff daily and as needed albuterol.   He is to start fluticasone nasal spray, 2 sprays per nostril daily and ipratropium nasal spray, 2 sprays per nostril twice daily.   He is to start prednisone taper.   Follow up in 6 weeks with pulmonary function testing.  Melody Comas, MD [facility] Office: [phone]    Current Outpatient Medications:    albuterol (VENTOLIN HFA) 108 (90 Base) MCG/ACT inhaler, Inhale 1-2 puffs into the lungs every 6 (six) hours as needed for wheezing or shortness of breath., Disp: , Rfl:    diphenhydrAMINE (BENADRYL) 25 MG tablet, Take 25 mg by mouth every 6 (six) hours as needed., Disp: , Rfl:    EFFEXOR XR 75 MG 24 hr capsule, Take 75 mg by mouth daily with breakfast., Disp: , Rfl:    EPINEPHrine (AUVI-Q) 0.3 mg/0.3 mL IJ SOAJ injection, Use as directed for severe allergic reaction, Disp: 2 each, Rfl: 1   fluticasone (FLONASE) 50 MCG/ACT nasal spray, Place 2 sprays into both nostrils daily., Disp: 16 g, Rfl: 2   Fluticasone-Umeclidin-Vilant (TRELEGY ELLIPTA) 100-62.5-25 MCG/INH AEPB, Inhale 1 puff into the lungs daily., Disp: 1 each, Rfl: 6   ipratropium (ATROVENT) 0.03 % nasal spray, Place 2 sprays into both nostrils every 12 (twelve) hours., Disp: 30 mL, Rfl: 12   Melatonin 10 MG CAPS, Take by mouth., Disp: , Rfl:    Multiple Vitamin (MULTIVITAMIN PO), Take 1 tablet by mouth daily., Disp: , Rfl:    prazosin (MINIPRESS) 1 MG capsule, Take by mouth., Disp: , Rfl:    predniSONE (DELTASONE) 10 MG tablet, Take 4 tablets (40 mg total) by mouth daily with breakfast for 3 days, THEN 3 tablets (30 mg total) daily with breakfast for 3 days, THEN 2 tablets (20 mg total) daily with breakfast for 3 days, THEN 1 tablet (10 mg total) daily with breakfast  for 3 days., Disp: 30 tablet, Rfl: 0   sertraline (ZOLOFT) 50 MG tablet, Take 100 mg by mouth daily. , Disp: , Rfl:    Spacer/Aero-Holding Chambers (AEROCHAMBER MV) inhaler, Use as instructed, Disp: 1 each, Rfl: 0   traZODone (DESYREL) 50 MG tablet, Take by mouth., Disp: , Rfl:    Vitamin D, Ergocalciferol, (DRISDOL) 1.25 MG (50000 UNIT) CAPS capsule, Take 50,000 Units by mouth once a week., Disp: , Rfl:    ipratropium-albuterol (DUONEB) 0.5-2.5 (3) MG/3ML SOLN, Take 3 mLs by nebulization every 6 (six) hours as needed., Disp: 360 mL, Rfl: 0

## 2022-03-15 ENCOUNTER — Encounter: Payer: Self-pay | Admitting: Pulmonary Disease

## 2022-03-17 LAB — IGE: IgE (Immunoglobulin E), Serum: 133 IU/mL (ref 6–495)

## 2022-03-17 LAB — CBC WITH DIFFERENTIAL/PLATELET
Basophils Absolute: 0.1 10*3/uL (ref 0.0–0.2)
Basos: 1 %
EOS (ABSOLUTE): 0.3 10*3/uL (ref 0.0–0.4)
Eos: 4 %
Hematocrit: 49 % (ref 37.5–51.0)
Hemoglobin: 15.9 g/dL (ref 13.0–17.7)
Immature Grans (Abs): 0 10*3/uL (ref 0.0–0.1)
Immature Granulocytes: 0 %
Lymphocytes Absolute: 2 10*3/uL (ref 0.7–3.1)
Lymphs: 25 %
MCH: 27.2 pg (ref 26.6–33.0)
MCHC: 32.4 g/dL (ref 31.5–35.7)
MCV: 84 fL (ref 79–97)
Monocytes Absolute: 0.6 10*3/uL (ref 0.1–0.9)
Monocytes: 7 %
Neutrophils Absolute: 5 10*3/uL (ref 1.4–7.0)
Neutrophils: 63 %
Platelets: 308 10*3/uL (ref 150–450)
RBC: 5.84 x10E6/uL — ABNORMAL HIGH (ref 4.14–5.80)
RDW: 14.5 % (ref 11.6–15.4)
WBC: 7.9 10*3/uL (ref 3.4–10.8)

## 2022-03-20 IMAGING — DX DG CHEST 2V
2 series · 2 of 2 positions shown · non-contrast
Comparison: 12/09/2018

CLINICAL DATA: Dry cough for 6-7 weeks, history asthma

EXAM:
CHEST - 2 VIEW

[dg chest 2 view (1 of 2)]
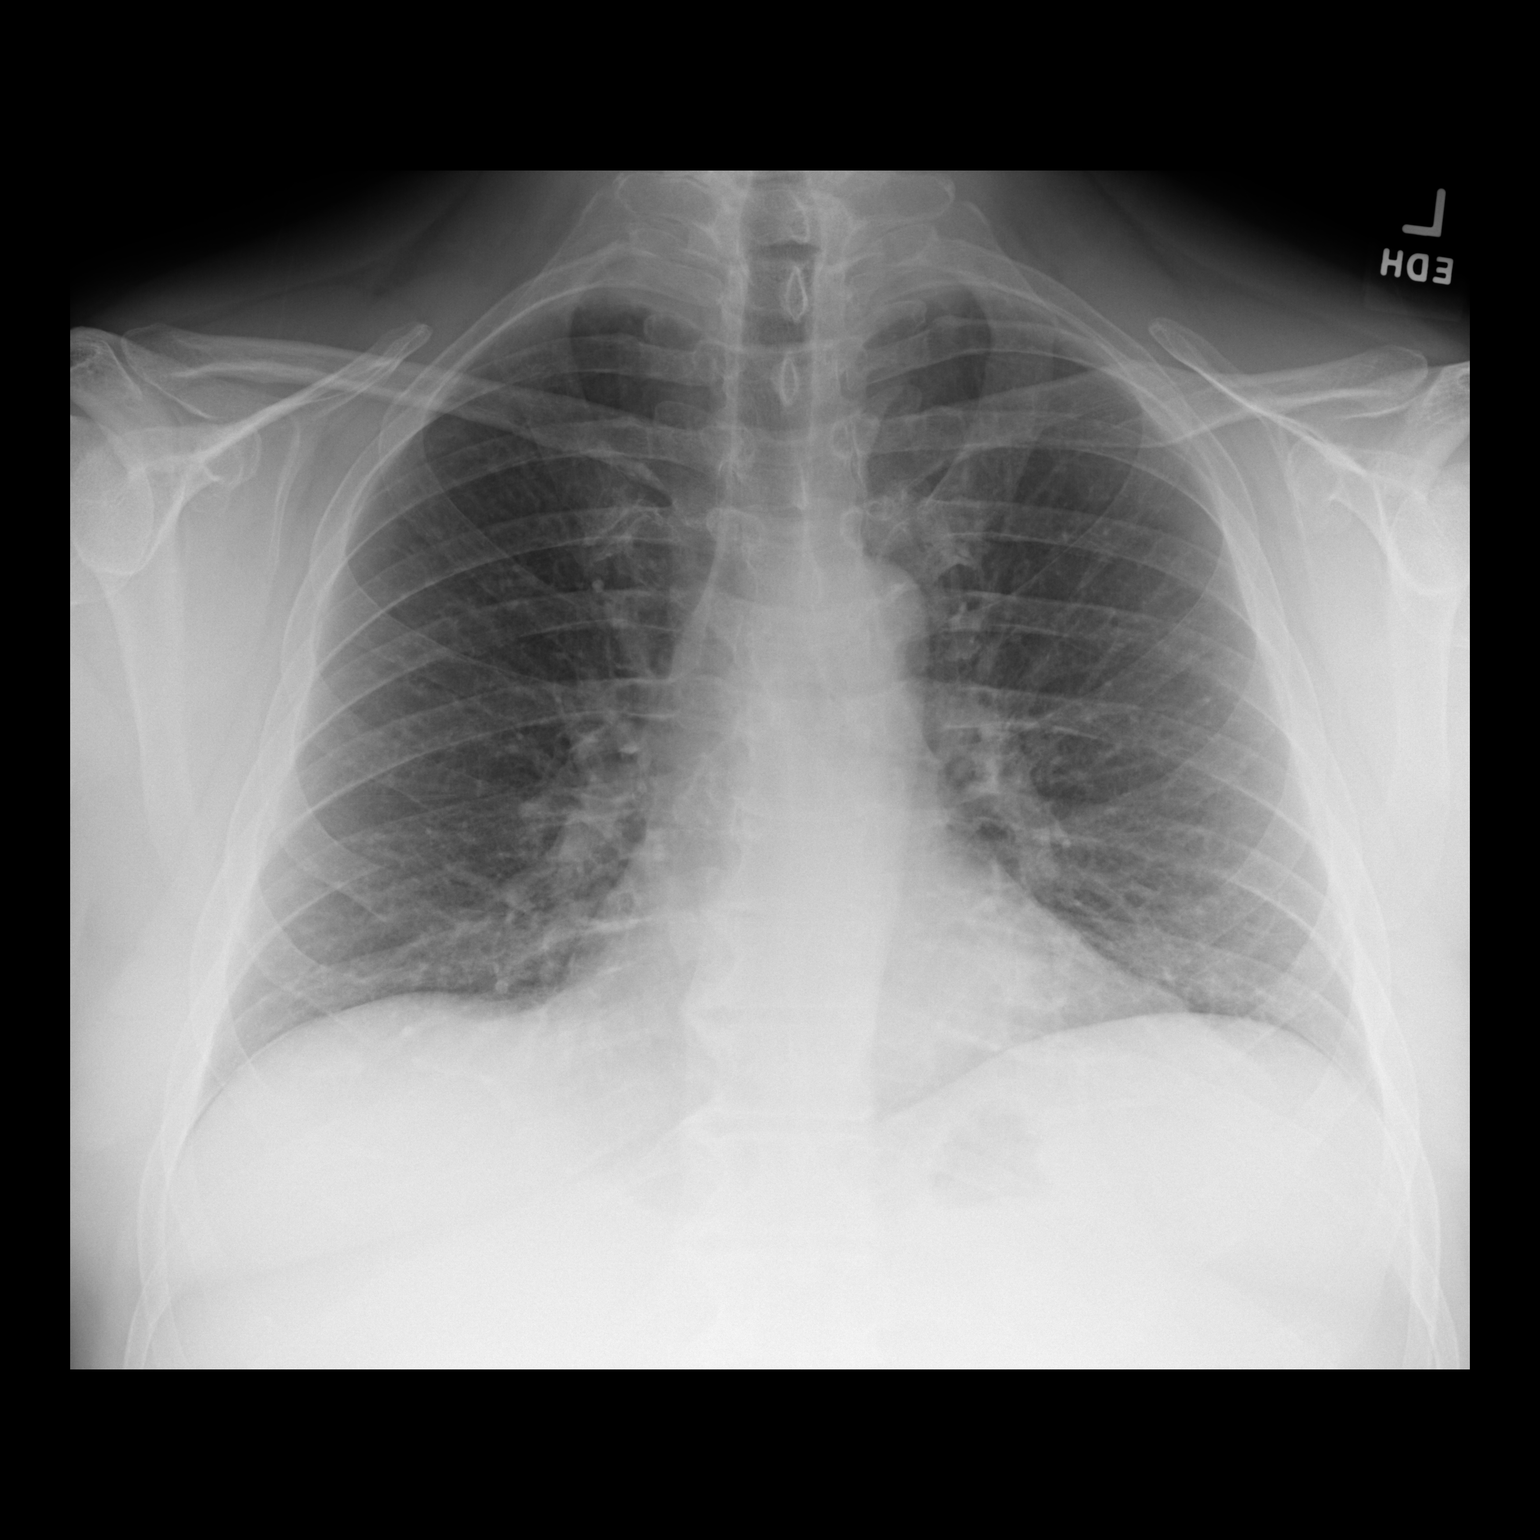

[dg chest 2 view (2 of 2)]
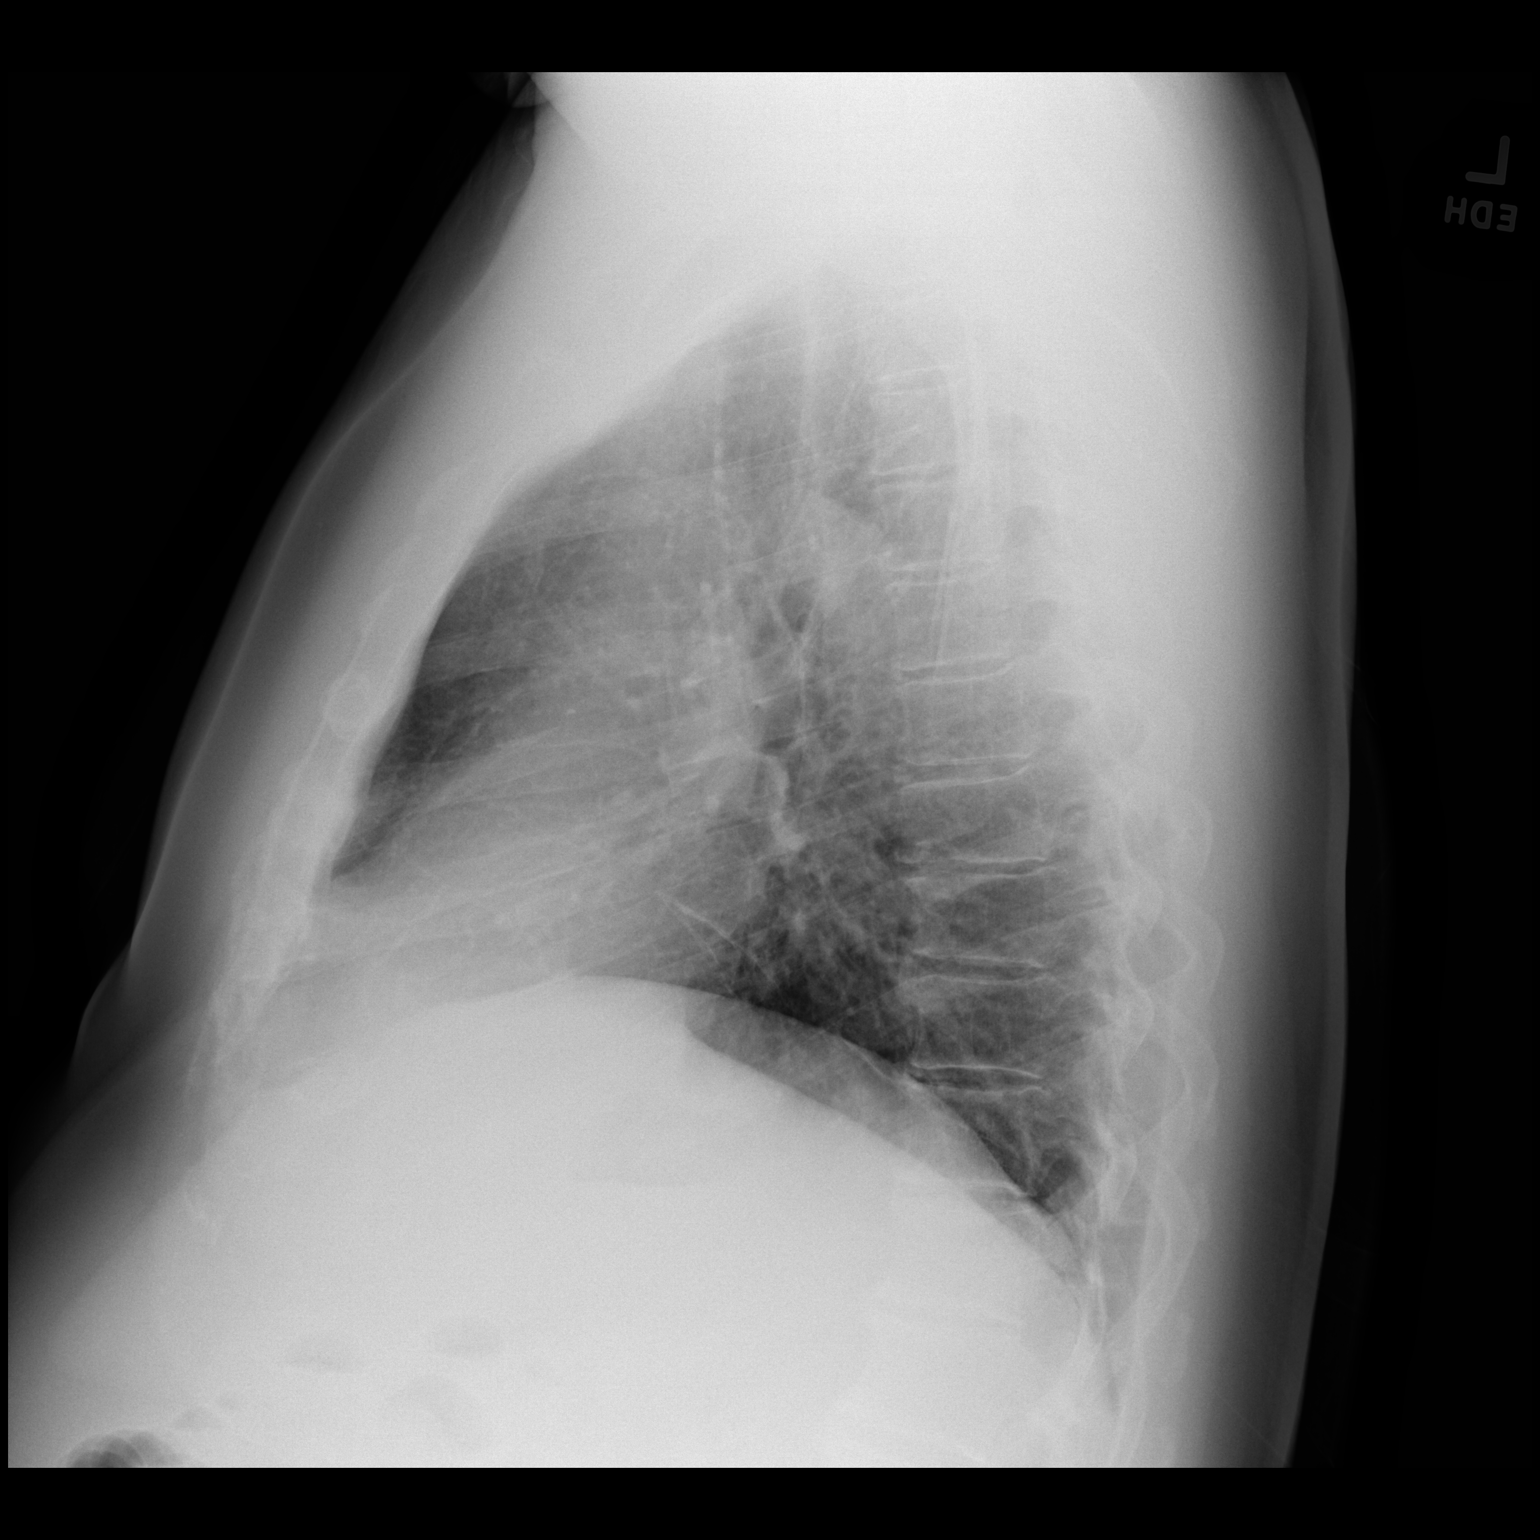

[2 of 2 positions shown; findings below may reference images not displayed]

FINDINGS: Normal heart size, mediastinal contours, and pulmonary vascularity.

Minimal RIGHT basilar atelectasis.

Lungs otherwise clear.

No pulmonary infiltrate, pleural effusion or pneumothorax.

Scattered endplate spur formation thoracic spine.
IMPRESSION: Minimal RIGHT basilar atelectasis.

## 2022-04-11 ENCOUNTER — Ambulatory Visit (INDEPENDENT_AMBULATORY_CARE_PROVIDER_SITE_OTHER): Payer: BC Managed Care – PPO | Admitting: Pulmonary Disease

## 2022-04-11 DIAGNOSIS — J4520 Mild intermittent asthma, uncomplicated: Secondary | ICD-10-CM

## 2022-04-11 LAB — PULMONARY FUNCTION TEST
DL/VA % pred: 126 %
DL/VA: 5.48 ml/min/mmHg/L
DLCO cor % pred: 95 %
DLCO cor: 28.09 ml/min/mmHg
DLCO unc % pred: 98 %
DLCO unc: 29.07 ml/min/mmHg
FEF 25-75 Post: 3.9 L/sec
FEF 25-75 Pre: 4.42 L/sec
FEF2575-%Change-Post: -11 %
FEF2575-%Pred-Post: 115 %
FEF2575-%Pred-Pre: 131 %
FEV1-%Change-Post: -3 %
FEV1-%Pred-Post: 73 %
FEV1-%Pred-Pre: 75 %
FEV1-Post: 2.87 L
FEV1-Pre: 2.97 L
FEV1FVC-%Change-Post: -1 %
FEV1FVC-%Pred-Pre: 113 %
FEV6-%Change-Post: 0 %
FEV6-%Pred-Post: 67 %
FEV6-%Pred-Pre: 67 %
FEV6-Post: 3.31 L
FEV6-Pre: 3.35 L
FEV6FVC-%Change-Post: 1 %
FEV6FVC-%Pred-Post: 104 %
FEV6FVC-%Pred-Pre: 102 %
FVC-%Change-Post: -2 %
FVC-%Pred-Post: 64 %
FVC-%Pred-Pre: 66 %
FVC-Post: 3.32 L
FVC-Pre: 3.4 L
Post FEV1/FVC ratio: 86 %
Post FEV6/FVC ratio: 100 %
Pre FEV1/FVC ratio: 87 %
Pre FEV6/FVC Ratio: 98 %
RV % pred: 94 %
RV: 2.06 L
TLC % pred: 77 %
TLC: 5.6 L

## 2022-04-11 NOTE — Patient Instructions (Signed)
Full PFT Performed Today  

## 2022-04-11 NOTE — Progress Notes (Signed)
Full PFT Performed Today  

## 2022-04-26 ENCOUNTER — Ambulatory Visit: Payer: BC Managed Care – PPO | Admitting: Pulmonary Disease

## 2022-06-20 ENCOUNTER — Ambulatory Visit: Payer: BC Managed Care – PPO | Admitting: Pulmonary Disease

## 2022-06-20 NOTE — Progress Notes (Deleted)
Synopsis: Follow up  Subjective:   PATIENT ID: Timothy Murphy GENDER: male DOB: 04-28-1965, MRN: [id number]  HPI  No chief complaint on file.  Timothy Murphy is a 57 year old male with GERD, hiatal hernia, obstructive sleep apnea on CPAP, hypertension and history of childhood asthma who returns with chronic cough and shortness of breath.  PFTs showed mild restrictive defect.   OV 03/14/22 He had Nissen fundoplication 05/05/2021 with significant improvement in his GERD.  He continues to have shortness of breath and cough with certain exposures like car exhaust and smoke exposure.  He has intermittent wheezing. He is using a humidifier in his car and has updated car air filter.   He is using trelegy ellipta 1 puff daily and feels like it only works for 4-6 hours. He has significant sinus congestion and post-nasal drainage.   OV 10/20/19 His PFTs showed mild restrictive defect and was started on flovent 2 puffs twice daily for concern of bronchitis due to air way thickening on his chest imaging. He was previously on steroids and nebulizer treatments without much improvement as well.   He continues to have terrible reflux symptoms. He is taking pepcid and omeprazole. He has also been elevating the head of his bed when sleeping.   He was concerned that his job environment could be affecting his lungs leading to cough as he started a new job at [employer] where he reported increased cough and shortness of breath while at work when exposed to [employer] smoke in the kitchen. He has not been in the work environment and he has had no improvement in his cough.    Past Medical History:  Diagnosis Date   Anxiety    Asthma    as a child   Depression    Hypertension    OSA (obstructive sleep apnea)    OSA on CPAP    wears cpap about 60% of time   Seasonal allergies      Family History  Problem Relation Age of Onset   Allergic rhinitis Mother    Sleep disorder Mother    Sleep  disorder Sister    Lung cancer Maternal Uncle    Sleep disorder Maternal Grandmother    Cancer Maternal Grandfather        Blood cancer   Asthma Neg Hx    Eczema Neg Hx    Urticaria Neg Hx    Angioedema Neg Hx      Social History   Socioeconomic History   Marital status: Married    Spouse name: Not on file   Number of children: Not on file   Years of education: Not on file   Highest education level: Not on file  Occupational History   Occupation: Lobbyist: [employer]  Tobacco Use   Smoking status: Former    Packs/day: 1.00    Years: 24.00    Additional pack years: 0.00    Total pack years: 24.00    Types: Cigarettes    Start date: 1984    Quit date: 2008    Years since quitting: 16.4   Smokeless tobacco: Never  Vaping Use   Vaping Use: Never used  Substance and Sexual Activity   Alcohol use: Yes    Comment: once a week seasonal   Drug use: Never   Sexual activity: Yes    Partners: Female  Other Topics Concern   Not on file  Social History Narrative   ** Merged  History Encounter **    Left handed   Scientist, product/process development    Social Determinants of Health   Financial Resource Strain: Not on file  Food Insecurity: Not on file  Transportation Needs: Not on file  Physical Activity: Not on file  Stress: Not on file  Social Connections: Not on file  Intimate Partner Violence: Not on file     Allergies  Allergen Reactions   Shellfish Allergy     Abdomen pain      Outpatient Medications Prior to Visit  Medication Sig Dispense Refill   albuterol (VENTOLIN HFA) 108 (90 Base) MCG/ACT inhaler Inhale 1-2 puffs into the lungs every 6 (six) hours as needed for wheezing or shortness of breath.     diphenhydrAMINE (BENADRYL) 25 MG tablet Take 25 mg by mouth every 6 (six) hours as needed.     EFFEXOR XR 75 MG 24 hr capsule Take 75 mg by mouth daily with breakfast.     EPINEPHrine (AUVI-Q) 0.3 mg/0.3 mL IJ SOAJ injection Use as directed for severe allergic  reaction 2 each 1   fluticasone (FLONASE) 50 MCG/ACT nasal spray Place 2 sprays into both nostrils daily. 16 g 2   Fluticasone-Umeclidin-Vilant (TRELEGY ELLIPTA) 100-62.5-25 MCG/INH AEPB Inhale 1 puff into the lungs daily. 1 each 6   ipratropium (ATROVENT) 0.03 % nasal spray Place 2 sprays into both nostrils every 12 (twelve) hours. 30 mL 12   ipratropium-albuterol (DUONEB) 0.5-2.5 (3) MG/3ML SOLN Take 3 mLs by nebulization every 6 (six) hours as needed. 360 mL 0   Melatonin 10 MG CAPS Take by mouth.     Multiple Vitamin (MULTIVITAMIN PO) Take 1 tablet by mouth daily.     prazosin (MINIPRESS) 1 MG capsule Take by mouth.     sertraline (ZOLOFT) 50 MG tablet Take 100 mg by mouth daily.      Spacer/Aero-Holding Chambers (AEROCHAMBER MV) inhaler Use as instructed 1 each 0   traZODone (DESYREL) 50 MG tablet Take by mouth.     Vitamin D, Ergocalciferol, (DRISDOL) 1.25 MG (50000 UNIT) CAPS capsule Take 50,000 Units by mouth once a week.     No facility-administered medications prior to visit.    Review of Systems  Constitutional:  Negative for chills, diaphoresis, fever, malaise/fatigue and weight loss.  HENT:  Negative for congestion, sinus pain and sore throat.   Respiratory:  Positive for cough, shortness of breath and wheezing. Negative for hemoptysis and sputum production.   Cardiovascular:  Negative for chest pain, palpitations, orthopnea, claudication, leg swelling and PND.  Gastrointestinal:  Negative for abdominal pain, blood in stool, constipation, diarrhea, heartburn, melena, nausea and vomiting.  Genitourinary:  Negative for dysuria and hematuria.  Musculoskeletal:  Negative for joint pain and myalgias.  Skin:  Negative for itching and rash.  Neurological:  Negative for dizziness and weakness.  Endo/Heme/Allergies:  Does not bruise/bleed easily.  Psychiatric/Behavioral: Negative.      Objective:   There were no vitals filed for this visit.  Physical Exam Constitutional:       General: He is not in acute distress.    Appearance: He is obese. He is not ill-appearing.  HENT:     Head: Normocephalic and atraumatic.  Eyes:     General: No scleral icterus.    Conjunctiva/sclera: Conjunctivae normal.  Cardiovascular:     Rate and Rhythm: Normal rate and regular rhythm.     Pulses: Normal pulses.     Heart sounds: Normal heart sounds. No murmur heard. Pulmonary:  Effort: Pulmonary effort is normal.     Breath sounds: Normal breath sounds.  Musculoskeletal:     Right lower leg: No edema.     Left lower leg: No edema.  Skin:    General: Skin is warm and dry.  Neurological:     General: No focal deficit present.     Mental Status: He is alert and oriented to person, place, and time. Mental status is at baseline.     CBC    Component Value Date/Time   WBC 7.9 03/14/2022 1011   WBC 8.7 10/10/2019 0941   RBC 5.84 (H) 03/14/2022 1011   RBC 5.27 10/10/2019 0941   HGB 15.9 03/14/2022 1011   HCT 49.0 03/14/2022 1011   PLT 308 03/14/2022 1011   MCV 84 03/14/2022 1011   MCH 27.2 03/14/2022 1011   MCH 27.9 08/12/2019 0413   MCHC 32.4 03/14/2022 1011   MCHC 33.4 10/10/2019 0941   RDW 14.5 03/14/2022 1011   LYMPHSABS 2.0 03/14/2022 1011   MONOABS 0.7 10/10/2019 0941   EOSABS 0.3 03/14/2022 1011   BASOSABS 0.1 03/14/2022 1011     Chest imaging: CT Chest 02/02/22 1. No acute cardiopulmonary disease. 2. Stable bilateral pulmonary nodules with the largest measuring 6 mm over the right lower lobe. These have been stable since 2021. No further imaging follow-up recommended. 3. 3.7 cm simple right renal cyst. 4. Two subcentimeter hypodensities over the dome of the liver not well seen previously. These may represent small cysts or hemangiomas.  CTA Chest 08/10/19 1. Negative examination for pulmonary embolism. 2. Mild, diffuse bilateral bronchial wall thickening, consistent with nonspecific infectious or inflammatory bronchitis. 3. There is a 5 mm  subpleural nodule of the dependent right lower Lobe. 4. Hepatic steatosis.  CT Coronary Scan 10/06/19 Areas of scarring in the lung bases.   8 mm posterior right lower lobe subpleural nodule. Non-contrast chest CT at 6-12 months is recommended. If the nodule is stable at time of repeat CT, then future CT at 18-24 months (from today's scan) is considered optional for low-risk patients, but is recommended for high-risk patients.   PFT: FEV1/FVC 88 FEV1 60% FVC 53% TLC 78% DLCO: 90%  Labs: Myositis Panel: Negative Hypersensitivity Pneumonitis Panel: Negative Aldolase: 6 CRP: 1 IgE: 211 ANA: Negative ANCA: Negative  Echo: 08/11/19  1. Very difficult study. Poor echo windowns. LV function is normal  without WMA.   2. Left ventricular ejection fraction, by estimation, is 60 to 65%. The  left ventricle has normal function. The left ventricle has no regional  wall motion abnormalities. Left ventricular diastolic parameters were  normal.   3. Right ventricular systolic function is normal. The right ventricular  size is normal.   4. The mitral valve is grossly normal. No evidence of mitral valve  regurgitation. No evidence of mitral stenosis.   5. The aortic valve was not well visualized. Aortic valve regurgitation  is not visualized. No aortic stenosis is present.     Assessment & Plan:   No diagnosis found.  Discussion: Timothy Murphy is a 57 year old male with GERD, hiatal hernia, obstructive sleep apnea on CPAP, hypertension and history of childhood asthma who returns with chronic cough.    He is to continue trelegy 100, 1 puff daily and as needed albuterol.   He is to start fluticasone nasal spray, 2 sprays per nostril daily and ipratropium nasal spray, 2 sprays per nostril twice daily.   He is to start prednisone taper.  Follow up in 6 weeks with pulmonary function testing.  Melody Comas, MD [facility] Office: [phone]    Current  Outpatient Medications:    albuterol (VENTOLIN HFA) 108 (90 Base) MCG/ACT inhaler, Inhale 1-2 puffs into the lungs every 6 (six) hours as needed for wheezing or shortness of breath., Disp: , Rfl:    diphenhydrAMINE (BENADRYL) 25 MG tablet, Take 25 mg by mouth every 6 (six) hours as needed., Disp: , Rfl:    EFFEXOR XR 75 MG 24 hr capsule, Take 75 mg by mouth daily with breakfast., Disp: , Rfl:    EPINEPHrine (AUVI-Q) 0.3 mg/0.3 mL IJ SOAJ injection, Use as directed for severe allergic reaction, Disp: 2 each, Rfl: 1   fluticasone (FLONASE) 50 MCG/ACT nasal spray, Place 2 sprays into both nostrils daily., Disp: 16 g, Rfl: 2   Fluticasone-Umeclidin-Vilant (TRELEGY ELLIPTA) 100-62.5-25 MCG/INH AEPB, Inhale 1 puff into the lungs daily., Disp: 1 each, Rfl: 6   ipratropium (ATROVENT) 0.03 % nasal spray, Place 2 sprays into both nostrils every 12 (twelve) hours., Disp: 30 mL, Rfl: 12   ipratropium-albuterol (DUONEB) 0.5-2.5 (3) MG/3ML SOLN, Take 3 mLs by nebulization every 6 (six) hours as needed., Disp: 360 mL, Rfl: 0   Melatonin 10 MG CAPS, Take by mouth., Disp: , Rfl:    Multiple Vitamin (MULTIVITAMIN PO), Take 1 tablet by mouth daily., Disp: , Rfl:    prazosin (MINIPRESS) 1 MG capsule, Take by mouth., Disp: , Rfl:    sertraline (ZOLOFT) 50 MG tablet, Take 100 mg by mouth daily. , Disp: , Rfl:    Spacer/Aero-Holding Chambers (AEROCHAMBER MV) inhaler, Use as instructed, Disp: 1 each, Rfl: 0   traZODone (DESYREL) 50 MG tablet, Take by mouth., Disp: , Rfl:    Vitamin D, Ergocalciferol, (DRISDOL) 1.25 MG (50000 UNIT) CAPS capsule, Take 50,000 Units by mouth once a week., Disp: , Rfl:

## 2022-06-23 ENCOUNTER — Ambulatory Visit
Admission: RE | Admit: 2022-06-23 | Discharge: 2022-06-23 | Disposition: A | Discharge status: Home / Self Care | Payer: BC Managed Care – PPO | Source: Ambulatory Visit | Attending: Physician Assistant | Admitting: Physician Assistant

## 2022-06-23 ENCOUNTER — Other Ambulatory Visit: Payer: Self-pay | Admitting: Physician Assistant

## 2022-06-23 DIAGNOSIS — M25561 Pain in right knee: Secondary | ICD-10-CM

## 2022-11-16 ENCOUNTER — Encounter (HOSPITAL_COMMUNITY)
Admission: EM | Disposition: A | Discharge status: Home / Self Care | Payer: Self-pay | Source: Home / Self Care | Attending: Emergency Medicine

## 2022-11-16 ENCOUNTER — Ambulatory Visit (HOSPITAL_COMMUNITY)
Admission: EM | Admit: 2022-11-16 | Discharge: 2022-11-16 | Disposition: A | Discharge status: Home / Self Care | Payer: BC Managed Care – PPO | Attending: Emergency Medicine | Admitting: Emergency Medicine

## 2022-11-16 ENCOUNTER — Emergency Department (HOSPITAL_COMMUNITY): Payer: BC Managed Care – PPO | Admitting: Anesthesiology

## 2022-11-16 ENCOUNTER — Encounter (HOSPITAL_COMMUNITY): Payer: Self-pay

## 2022-11-16 ENCOUNTER — Other Ambulatory Visit: Payer: Self-pay

## 2022-11-16 ENCOUNTER — Emergency Department (HOSPITAL_COMMUNITY): Payer: BC Managed Care – PPO

## 2022-11-16 DIAGNOSIS — Z87891 Personal history of nicotine dependence: Secondary | ICD-10-CM | POA: Insufficient documentation

## 2022-11-16 DIAGNOSIS — F419 Anxiety disorder, unspecified: Secondary | ICD-10-CM | POA: Insufficient documentation

## 2022-11-16 DIAGNOSIS — I10 Essential (primary) hypertension: Secondary | ICD-10-CM | POA: Insufficient documentation

## 2022-11-16 DIAGNOSIS — M1990 Unspecified osteoarthritis, unspecified site: Secondary | ICD-10-CM | POA: Diagnosis not present

## 2022-11-16 DIAGNOSIS — G4733 Obstructive sleep apnea (adult) (pediatric): Secondary | ICD-10-CM | POA: Insufficient documentation

## 2022-11-16 DIAGNOSIS — K219 Gastro-esophageal reflux disease without esophagitis: Secondary | ICD-10-CM | POA: Diagnosis not present

## 2022-11-16 DIAGNOSIS — K381 Appendicular concretions: Secondary | ICD-10-CM | POA: Diagnosis not present

## 2022-11-16 DIAGNOSIS — F32A Depression, unspecified: Secondary | ICD-10-CM | POA: Insufficient documentation

## 2022-11-16 DIAGNOSIS — Z6834 Body mass index (BMI) 34.0-34.9, adult: Secondary | ICD-10-CM | POA: Insufficient documentation

## 2022-11-16 DIAGNOSIS — K3532 Acute appendicitis with perforation and localized peritonitis, without abscess: Secondary | ICD-10-CM | POA: Insufficient documentation

## 2022-11-16 DIAGNOSIS — K3580 Unspecified acute appendicitis: Secondary | ICD-10-CM | POA: Diagnosis present

## 2022-11-16 HISTORY — PX: LAPAROSCOPIC APPENDECTOMY: SHX408

## 2022-11-16 LAB — URINALYSIS, ROUTINE W REFLEX MICROSCOPIC
Bilirubin Urine: NEGATIVE
Glucose, UA: 150 mg/dL — AB
Hgb urine dipstick: NEGATIVE
Ketones, ur: NEGATIVE mg/dL
Leukocytes,Ua: NEGATIVE
Nitrite: NEGATIVE
Protein, ur: NEGATIVE mg/dL
Specific Gravity, Urine: >1.046 — ABNORMAL HIGH (ref 1.005–1.030)
pH: 6 (ref 5.0–8.0)

## 2022-11-16 LAB — COMPREHENSIVE METABOLIC PANEL
ALT: 42 U/L (ref 0–44)
AST: 34 U/L (ref 15–41)
Albumin: 3.8 g/dL (ref 3.5–5.0)
Alkaline Phosphatase: 123 U/L (ref 38–126)
Anion gap: 11 (ref 5–15)
BUN: 17 mg/dL (ref 6–20)
CO2: 21 mmol/L — ABNORMAL LOW (ref 22–32)
Calcium: 9.2 mg/dL (ref 8.9–10.3)
Chloride: 104 mmol/L (ref 98–111)
Creatinine, Ser: 1.33 mg/dL — ABNORMAL HIGH (ref 0.61–1.24)
GFR, Estimated: >60 mL/min (ref >60)
Glucose, Bld: 190 mg/dL — ABNORMAL HIGH (ref 70–99)
Potassium: 4.3 mmol/L (ref 3.5–5.1)
Sodium: 136 mmol/L (ref 135–145)
Total Bilirubin: 0.5 mg/dL (ref 0.3–1.2)
Total Protein: 7.2 g/dL (ref 6.5–8.1)

## 2022-11-16 LAB — CBC
HCT: 43.9 % (ref 39.0–52.0)
Hemoglobin: 14.4 g/dL (ref 13.0–17.0)
MCH: 28 pg (ref 26.0–34.0)
MCHC: 32.8 g/dL (ref 30.0–36.0)
MCV: 85.2 fL (ref 80.0–100.0)
Platelets: 277 10*3/uL (ref 150–400)
RBC: 5.15 MIL/uL (ref 4.22–5.81)
RDW: 13.9 % (ref 11.5–15.5)
WBC: 11.2 10*3/uL — ABNORMAL HIGH (ref 4.0–10.5)
nRBC: 0 % (ref 0.0–0.2)

## 2022-11-16 LAB — LIPASE, BLOOD: Lipase: 28 U/L (ref 11–51)

## 2022-11-16 SURGERY — APPENDECTOMY, LAPAROSCOPIC
Anesthesia: General

## 2022-11-16 MED ORDER — DEXAMETHASONE SODIUM PHOSPHATE 10 MG/ML IJ SOLN
INTRAMUSCULAR | Status: AC
  Filled 2022-11-16: qty 1

## 2022-11-16 MED ORDER — IOHEXOL 350 MG/ML SOLN
75.0000 mL | Freq: Once | INTRAVENOUS | Status: AC | PRN
  Administered 2022-11-16: 75 mL via INTRAVENOUS

## 2022-11-16 MED ORDER — HYDROMORPHONE HCL 1 MG/ML IJ SOLN: 1.0000 mg | Freq: Once | INTRAMUSCULAR | Status: DC

## 2022-11-16 MED ORDER — LACTATED RINGERS IV SOLN: INTRAVENOUS | Status: DC

## 2022-11-16 MED ORDER — FENTANYL CITRATE (PF) 100 MCG/2ML IJ SOLN: 25.0000 ug | INTRAMUSCULAR | Status: DC | PRN

## 2022-11-16 MED ORDER — PHENYLEPHRINE 80 MCG/ML (10ML) SYRINGE FOR IV PUSH (FOR BLOOD PRESSURE SUPPORT)
PREFILLED_SYRINGE | INTRAVENOUS | Status: DC | PRN
  Administered 2022-11-16 (×5): 160 ug via INTRAVENOUS

## 2022-11-16 MED ORDER — OXYCODONE HCL 5 MG/5ML PO SOLN: 5.0000 mg | Freq: Once | ORAL | Status: DC | PRN

## 2022-11-16 MED ORDER — SUCCINYLCHOLINE CHLORIDE 200 MG/10ML IV SOSY
PREFILLED_SYRINGE | INTRAVENOUS | Status: DC | PRN
  Administered 2022-11-16: 200 mg via INTRAVENOUS

## 2022-11-16 MED ORDER — BUPIVACAINE HCL (PF) 0.25 % IJ SOLN
INTRAMUSCULAR | Status: AC
  Filled 2022-11-16: qty 30

## 2022-11-16 MED ORDER — HYDROMORPHONE HCL 1 MG/ML IJ SOLN
0.5000 mg | Freq: Once | INTRAMUSCULAR | Status: AC
  Administered 2022-11-16: 0.5 mg via INTRAVENOUS
  Filled 2022-11-16: qty 1

## 2022-11-16 MED ORDER — ONDANSETRON HCL 4 MG/2ML IJ SOLN
4.0000 mg | Freq: Once | INTRAMUSCULAR | Status: AC
  Administered 2022-11-16: 4 mg via INTRAVENOUS
  Filled 2022-11-16: qty 2

## 2022-11-16 MED ORDER — ONDANSETRON HCL 4 MG/2ML IJ SOLN
INTRAMUSCULAR | Status: DC | PRN
  Administered 2022-11-16: 4 mg via INTRAVENOUS

## 2022-11-16 MED ORDER — SODIUM CHLORIDE 0.9 % IV SOLN
2.0000 g | Freq: Once | INTRAVENOUS | Status: AC
Start: 2022-11-16 — End: 2022-11-16
  Administered 2022-11-16: 2 g via INTRAVENOUS
  Filled 2022-11-16: qty 20

## 2022-11-16 MED ORDER — KETAMINE HCL 50 MG/5ML IJ SOSY
PREFILLED_SYRINGE | INTRAMUSCULAR | Status: AC
  Filled 2022-11-16: qty 5

## 2022-11-16 MED ORDER — ORAL CARE MOUTH RINSE: 15.0000 mL | Freq: Once | OROMUCOSAL | Status: AC

## 2022-11-16 MED ORDER — HYDROMORPHONE HCL 1 MG/ML IJ SOLN
1.0000 mg | Freq: Once | INTRAMUSCULAR | Status: AC
  Administered 2022-11-16: 1 mg via INTRAVENOUS
  Filled 2022-11-16: qty 1

## 2022-11-16 MED ORDER — ROCURONIUM BROMIDE 10 MG/ML (PF) SYRINGE
PREFILLED_SYRINGE | INTRAVENOUS | Status: AC
  Filled 2022-11-16: qty 10

## 2022-11-16 MED ORDER — ACETAMINOPHEN 500 MG PO TABS: 1000.0000 mg | ORAL_TABLET | Freq: Four times a day (QID) | ORAL | 3 refills | Status: AC

## 2022-11-16 MED ORDER — CHLORHEXIDINE GLUCONATE 0.12 % MT SOLN
15.0000 mL | Freq: Once | OROMUCOSAL | Status: AC
Start: 2022-11-16 — End: 2022-11-16

## 2022-11-16 MED ORDER — PROPOFOL 10 MG/ML IV BOLUS
INTRAVENOUS | Status: DC | PRN
  Administered 2022-11-16: 150 ug/kg/min via INTRAVENOUS
  Administered 2022-11-16: 200 mg via INTRAVENOUS

## 2022-11-16 MED ORDER — ROCURONIUM BROMIDE 10 MG/ML (PF) SYRINGE
PREFILLED_SYRINGE | INTRAVENOUS | Status: DC | PRN
  Administered 2022-11-16: 60 mg via INTRAVENOUS

## 2022-11-16 MED ORDER — STERILE WATER FOR IRRIGATION IR SOLN
Status: DC | PRN
  Administered 2022-11-16: 1000 mL

## 2022-11-16 MED ORDER — BUPIVACAINE LIPOSOME 1.3 % IJ SUSP
INTRAMUSCULAR | Status: AC
  Filled 2022-11-16: qty 20

## 2022-11-16 MED ORDER — LIDOCAINE 2% (20 MG/ML) 5 ML SYRINGE
INTRAMUSCULAR | Status: AC
  Filled 2022-11-16: qty 5

## 2022-11-16 MED ORDER — METHOCARBAMOL 750 MG PO TABS: 750.0000 mg | ORAL_TABLET | Freq: Four times a day (QID) | ORAL | 1 refills | Status: AC

## 2022-11-16 MED ORDER — ACETAMINOPHEN 500 MG PO TABS
1000.0000 mg | ORAL_TABLET | ORAL | Status: AC
  Administered 2022-11-16: 1000 mg via ORAL
  Filled 2022-11-16: qty 2

## 2022-11-16 MED ORDER — OXYCODONE HCL 5 MG PO TABS: 5.0000 mg | ORAL_TABLET | Freq: Once | ORAL | Status: DC | PRN

## 2022-11-16 MED ORDER — OXYCODONE HCL 5 MG PO TABS: 5.0000 mg | ORAL_TABLET | ORAL | 0 refills | Status: AC | PRN

## 2022-11-16 MED ORDER — PROPOFOL 10 MG/ML IV BOLUS
INTRAVENOUS | Status: AC
  Filled 2022-11-16: qty 20

## 2022-11-16 MED ORDER — FENTANYL CITRATE (PF) 250 MCG/5ML IJ SOLN
INTRAMUSCULAR | Status: DC | PRN
  Administered 2022-11-16 (×2): 50 ug via INTRAVENOUS

## 2022-11-16 MED ORDER — ONDANSETRON HCL 4 MG/2ML IJ SOLN
INTRAMUSCULAR | Status: AC
  Filled 2022-11-16: qty 2

## 2022-11-16 MED ORDER — LIDOCAINE 2% (20 MG/ML) 5 ML SYRINGE
INTRAMUSCULAR | Status: DC | PRN
  Administered 2022-11-16: 100 mg via INTRAVENOUS

## 2022-11-16 MED ORDER — SODIUM CHLORIDE 0.9 % IV BOLUS: 1000.0000 mL | Freq: Once | INTRAVENOUS | Status: DC

## 2022-11-16 MED ORDER — METRONIDAZOLE 500 MG/100ML IV SOLN
500.0000 mg | Freq: Once | INTRAVENOUS | Status: AC
  Administered 2022-11-16: 500 mg via INTRAVENOUS
  Filled 2022-11-16: qty 100

## 2022-11-16 MED ORDER — ONDANSETRON HCL 4 MG/2ML IJ SOLN: 4.0000 mg | Freq: Once | INTRAMUSCULAR | Status: DC | PRN

## 2022-11-16 MED ORDER — DOCUSATE SODIUM 100 MG PO CAPS: 100.0000 mg | ORAL_CAPSULE | Freq: Two times a day (BID) | ORAL | 2 refills | Status: AC

## 2022-11-16 MED ORDER — DEXAMETHASONE SODIUM PHOSPHATE 10 MG/ML IJ SOLN
INTRAMUSCULAR | Status: DC | PRN
  Administered 2022-11-16: 10 mg via INTRAVENOUS

## 2022-11-16 MED ORDER — MIDAZOLAM HCL 2 MG/2ML IJ SOLN
INTRAMUSCULAR | Status: DC | PRN
  Administered 2022-11-16: 2 mg via INTRAVENOUS

## 2022-11-16 MED ORDER — SODIUM CHLORIDE 0.9 % IV BOLUS
1000.0000 mL | Freq: Once | INTRAVENOUS | Status: AC
  Administered 2022-11-16: 1000 mL via INTRAVENOUS

## 2022-11-16 MED ORDER — BUPIVACAINE LIPOSOME 1.3 % IJ SUSP
INTRAMUSCULAR | Status: DC | PRN
  Administered 2022-11-16: 40 mL via SURGICAL_CAVITY

## 2022-11-16 MED ORDER — ALBUMIN HUMAN 5 % IV SOLN: INTRAVENOUS | Status: DC | PRN

## 2022-11-16 MED ORDER — MIDAZOLAM HCL 2 MG/2ML IJ SOLN
INTRAMUSCULAR | Status: AC
Start: 2022-11-16 — End: ?
  Filled 2022-11-16: qty 2

## 2022-11-16 MED ORDER — CHLORHEXIDINE GLUCONATE 0.12 % MT SOLN
OROMUCOSAL | Status: AC
  Administered 2022-11-16: 15 mL via OROMUCOSAL
  Filled 2022-11-16: qty 15

## 2022-11-16 MED ORDER — FENTANYL CITRATE (PF) 250 MCG/5ML IJ SOLN
INTRAMUSCULAR | Status: AC
  Filled 2022-11-16: qty 5

## 2022-11-16 MED ORDER — ACETAMINOPHEN 160 MG/5ML PO SOLN: 325.0000 mg | ORAL | Status: DC | PRN

## 2022-11-16 MED ORDER — IBUPROFEN 400 MG PO TABS
600.0000 mg | ORAL_TABLET | ORAL | Status: AC
  Administered 2022-11-16: 600 mg via ORAL
  Filled 2022-11-16: qty 1

## 2022-11-16 MED ORDER — IBUPROFEN 600 MG PO TABS: 600.0000 mg | ORAL_TABLET | Freq: Four times a day (QID) | ORAL | 1 refills | Status: AC

## 2022-11-16 MED ORDER — MEPERIDINE HCL 25 MG/ML IJ SOLN
6.2500 mg | INTRAMUSCULAR | Status: DC | PRN
Start: 2022-11-16 — End: 2022-11-16

## 2022-11-16 MED ORDER — ACETAMINOPHEN 325 MG PO TABS: 325.0000 mg | ORAL_TABLET | ORAL | Status: DC | PRN

## 2022-11-16 MED ORDER — SUGAMMADEX SODIUM 200 MG/2ML IV SOLN
INTRAVENOUS | Status: DC | PRN
  Administered 2022-11-16: 200 mg via INTRAVENOUS

## 2022-11-16 SURGICAL SUPPLY — 52 items
ADH SKN CLS APL DERMABOND .7 (GAUZE/BANDAGES/DRESSINGS) ×1
ADH SKN CLS LQ APL DERMABOND (GAUZE/BANDAGES/DRESSINGS) ×1
APL PRP STRL LF DISP 70% ISPRP (MISCELLANEOUS) ×1
APPLIER CLIP ROT 10 11.4 M/L (STAPLE)
APR CLP MED LRG 11.4X10 (STAPLE)
BAG COUNTER SPONGE SURGICOUNT (BAG) ×1 IMPLANT
BAG SPEC RTRVL 10 TROC 200 (ENDOMECHANICALS) ×1
BAG SPNG CNTER NS LX DISP (BAG) ×1
BLADE CLIPPER SURG (BLADE) IMPLANT
CANISTER SUCT 3000ML PPV (MISCELLANEOUS) IMPLANT
CHLORAPREP W/TINT 26 (MISCELLANEOUS) ×1 IMPLANT
CLIP APPLIE ROT 10 11.4 M/L (STAPLE) IMPLANT
COVER SURGICAL LIGHT HANDLE (MISCELLANEOUS) ×1 IMPLANT
CUTTER FLEX LINEAR 45M (STAPLE) ×1 IMPLANT
DERMABOND ADVANCED .7 DNX12 (GAUZE/BANDAGES/DRESSINGS) IMPLANT
DERMABOND ADVANCED .7 DNX6 (GAUZE/BANDAGES/DRESSINGS) ×1 IMPLANT
ELECT CAUTERY BLADE 6.4 (BLADE) ×1 IMPLANT
ELECT REM PT RETURN 9FT ADLT (ELECTROSURGICAL) ×1
ELECTRODE REM PT RTRN 9FT ADLT (ELECTROSURGICAL) ×1 IMPLANT
ENDOLOOP SUT PDS II 0 18 (SUTURE) IMPLANT
GLOVE BIO SURGEON STRL SZ 6.5 (GLOVE) ×1 IMPLANT
GLOVE BIOGEL PI IND STRL 6 (GLOVE) ×1 IMPLANT
GOWN STRL REUS W/ TWL LRG LVL3 (GOWN DISPOSABLE) ×3 IMPLANT
GOWN STRL REUS W/TWL LRG LVL3 (GOWN DISPOSABLE) ×3
IRRIG SUCT STRYKERFLOW 2 WTIP (MISCELLANEOUS)
IRRIGATION SUCT STRKRFLW 2 WTP (MISCELLANEOUS) IMPLANT
KIT BASIN OR (CUSTOM PROCEDURE TRAY) ×1 IMPLANT
KIT TURNOVER KIT B (KITS) ×1 IMPLANT
NS IRRIG 1000ML POUR BTL (IV SOLUTION) ×1 IMPLANT
PAD ARMBOARD 7.5X6 YLW CONV (MISCELLANEOUS) ×2 IMPLANT
PENCIL BUTTON HOLSTER BLD 10FT (ELECTRODE) ×1 IMPLANT
POUCH RETRIEVAL ECOSAC 10 (ENDOMECHANICALS) IMPLANT
RELOAD 45 VASCULAR/THIN (ENDOMECHANICALS) ×1 IMPLANT
RELOAD STAPLE 45 2.5 WHT GRN (ENDOMECHANICALS) ×1 IMPLANT
RELOAD STAPLE 45 3.5 BLU ETS (ENDOMECHANICALS) ×1 IMPLANT
RELOAD STAPLE TA45 3.5 REG BLU (ENDOMECHANICALS) ×1 IMPLANT
SCISSORS LAP 5X35 DISP (ENDOMECHANICALS) IMPLANT
SET TUBE SMOKE EVAC HIGH FLOW (TUBING) ×1 IMPLANT
SHEARS HARMONIC ACE PLUS 36CM (ENDOMECHANICALS) IMPLANT
SLEEVE Z-THREAD 5X100MM (TROCAR) ×1 IMPLANT
SPECIMEN JAR SMALL (MISCELLANEOUS) ×1 IMPLANT
SUT MNCRL AB 4-0 PS2 18 (SUTURE) ×1 IMPLANT
SUT VICRYL 0 UR6 27IN ABS (SUTURE) IMPLANT
SYS BAG RETRIEVAL 10MM (BASKET) ×1
SYSTEM BAG RETRIEVAL 10MM (BASKET) ×1 IMPLANT
TOWEL GREEN STERILE (TOWEL DISPOSABLE) ×1 IMPLANT
TOWEL GREEN STERILE FF (TOWEL DISPOSABLE) ×1 IMPLANT
TRAY LAPAROSCOPIC MC (CUSTOM PROCEDURE TRAY) ×1 IMPLANT
TROCAR BALLN 12MMX100 BLUNT (TROCAR) IMPLANT
TROCAR Z-THREAD OPTICAL 5X100M (TROCAR) ×1 IMPLANT
WARMER LAPAROSCOPE (MISCELLANEOUS) ×1 IMPLANT
WATER STERILE IRR 1000ML POUR (IV SOLUTION) ×1 IMPLANT

## 2022-11-16 NOTE — Addendum Note (Signed)
Addendum  created 11/16/22 1858 by Renford Dills, CRNA   Flowsheet accepted

## 2022-11-16 NOTE — H&P (Signed)
Timothy Murphy 09-Jan-1966  [id number].    Requesting MD: Pilar Plate, MD Chief Complaint/Reason for Consult: appendicitis   HPI:  Timothy Murphy is a 57 y/o M who presents with abdominal pain. He tells me that yesterday and 5PM he started to have discomfort in his chest that moved down to his lower, central abdomen. Associated symptoms include nausea. He denies fever, chills, vomiting, diarrhea or constipation. He has a history of Nissen Fundoplication in [place] 2 years ago. He tells me that since that time his belly button has protruded, but last night it went back in. He reports NKDA. Denies use of blood thinners. Says he is a former smoker who quit in 2008. Takes hemp gummies but denies other drug use. He tells me that sometimes he has trouble waking up from anesthesia. He denies a history of heart attack, CVA, or other cardiac issues. 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 He current works as a Research officer, trade [employer]. His significant other is at the bedside.   ROS: As above Review of Systems  All other systems reviewed and are negative.   Family History  Problem Relation Age of Onset   Allergic rhinitis Mother    Sleep disorder Mother    Sleep disorder Sister    Lung cancer Maternal Uncle    Sleep disorder Maternal Grandmother    Cancer Maternal Grandfather        Blood cancer   Asthma Neg Hx    Eczema Neg Hx    Urticaria Neg Hx    Angioedema Neg Hx     Past Medical History:  Diagnosis Date   Anxiety    Asthma    as a child   Depression    Hypertension    OSA (obstructive sleep apnea)    OSA on CPAP    wears cpap about 60% of time   Seasonal allergies     Past Surgical History:  Procedure Laterality Date   ESOPHAGEAL MANOMETRY N/A 12/17/2019   Procedure: ESOPHAGEAL MANOMETRY (EM);  Surgeon: Charlott Rakes, MD;  Location: [facility];  Service: Endoscopy;  Laterality: N/A;    NISSEN FUNDOPLICATION     PH IMPEDANCE STUDY N/A 12/17/2019   Procedure: PH IMPEDANCE STUDY;  Surgeon: Charlott Rakes, MD;  Location: [facility];  Service: Endoscopy;  Laterality: N/A;    Social History:  reports that he quit smoking about 16 years ago. His smoking use included cigarettes. He started smoking about 40 years ago. He has a 24 pack-year smoking history. He has never used smokeless tobacco. He reports current alcohol use. He reports that he does not use drugs.  Allergies:  Allergies  Allergen Reactions   Shellfish Allergy     Abdomen pain     (Not in a hospital admission)    Physical Exam: Blood pressure 136/86, pulse (!) 106, temperature 97.9 F (36.6 C), temperature source Oral, resp. rate 18, SpO2 98%. General: Pleasant white male laying on hospital bed, appears stated age, NAD. HEENT: head -normocephalic, atraumatic; Eyes: PERRLA, no conjunctival injection; anicteric sclerae Neck- Trachea is midline, no thyromegaly or JVD appreciated.  CV- RRR, normal S1/S2, no M/R/G, no lower extremity edema  Pulm- breathing is non-labored ORA Abd- soft, mild tenderness in the central, lower abdomen and RLQ without rebound or guarding. GU- deferred  MSK- UE/LE symmetrical, no cyanosis, clubbing, or edema. Neuro- CN II-XII grossly  in tact, no paresthesias. Psych- Alert and Oriented x3 with appropriate affect Skin: warm and dry, no rashes or lesions   Results for orders placed or performed during the hospital encounter of 11/16/22 (from the past 48 hour(s))  Lipase, blood     Status: None   Collection Time: 11/16/22  2:38 AM  Result Value Ref Range   Lipase 28 11 - 51 U/L    Comment: Performed at [facility], [address] [place], [place] [address]  Comprehensive metabolic panel     Status: Abnormal   Collection Time: 11/16/22  2:38 AM  Result Value Ref Range   Sodium 136 135 - 145 mmol/L   Potassium 4.3 3.5 - 5.1 mmol/L   Chloride 104 98 - 111 mmol/L   CO2  21 (L) 22 - 32 mmol/L   Glucose, Bld 190 (H) 70 - 99 mg/dL    Comment: Glucose reference range applies only to samples taken after fasting for at least 8 hours.   BUN 17 6 - 20 mg/dL   Creatinine, Ser 6.04 (H) 0.61 - 1.24 mg/dL   Calcium 9.2 8.9 - 54.0 mg/dL   Total Protein 7.2 6.5 - 8.1 g/dL   Albumin 3.8 3.5 - 5.0 g/dL   AST 34 15 - 41 U/L   ALT 42 0 - 44 U/L   Alkaline Phosphatase 123 38 - 126 U/L   Total Bilirubin 0.5 0.3 - 1.2 mg/dL   GFR, Estimated >98 >11 mL/min    Comment: (NOTE) Calculated using the CKD-EPI Creatinine Equation (2021)    Anion gap 11 5 - 15    Comment: Performed at [facility], [address], [place], [place] [address]  CBC     Status: Abnormal   Collection Time: 11/16/22  2:38 AM  Result Value Ref Range   WBC 11.2 (H) 4.0 - 10.5 K/uL   RBC 5.15 4.22 - 5.81 MIL/uL   Hemoglobin 14.4 13.0 - 17.0 g/dL   HCT 29.5 62.1 - 30.8 %   MCV 85.2 80.0 - 100.0 fL   MCH 28.0 26.0 - 34.0 pg   MCHC 32.8 30.0 - 36.0 g/dL   RDW 65.7 84.6 - 96.2 %   Platelets 277 150 - 400 K/uL   nRBC 0.0 0.0 - 0.2 %    Comment: Performed at [facility], [address] [place], [place] [address]  Urinalysis, Routine w reflex microscopic -Urine, Clean Catch     Status: Abnormal   Collection Time: 11/16/22  7:20 AM  Result Value Ref Range   Color, Urine YELLOW YELLOW   APPearance CLEAR CLEAR   Specific Gravity, Urine >1.046 (H) 1.005 - 1.030   pH 6.0 5.0 - 8.0   Glucose, UA 150 (A) NEGATIVE mg/dL   Hgb urine dipstick NEGATIVE NEGATIVE   Bilirubin Urine NEGATIVE NEGATIVE   Ketones, ur NEGATIVE NEGATIVE mg/dL   Protein, ur NEGATIVE NEGATIVE mg/dL   Nitrite NEGATIVE NEGATIVE   Leukocytes,Ua NEGATIVE NEGATIVE    Comment: Performed at [facility], [address], [place], [place] [address]   CT ABDOMEN PELVIS W CONTRAST  Result Date: 11/16/2022 CLINICAL DATA:  Right lower quadrant abdominal pain EXAM: CT ABDOMEN AND PELVIS WITH CONTRAST TECHNIQUE: Multidetector  CT imaging of the abdomen and pelvis was performed using the standard protocol following bolus administration of intravenous contrast. RADIATION DOSE REDUCTION: This exam was performed according to the departmental dose-optimization program which includes automated exposure control, adjustment of the  mA and/or kV according to patient size and/or use of iterative reconstruction technique. CONTRAST:  75mL OMNIPAQUE IOHEXOL 350 MG/ML SOLN COMPARISON:  None Available. FINDINGS: Lower chest:  Atelectasis in the lower lungs. Hepatobiliary: Suspected liver steatosis, but limited by postcontrast only imaging. 2 low-density areas in the upper right liver measuring up to 9 mm, reasonably cystic density as permitted by small lesion size and volume averaging.No evidence of biliary obstruction or stone. Pancreas: Unremarkable. Spleen: Unremarkable. Adrenals/Urinary Tract: Negative adrenals. No hydronephrosis or stone. Upper pole renal cyst on the right measuring 4 cm. No follow-up imaging is recommended. Unremarkable bladder. Stomach/Bowel: Dilated and fluid-filled appendix with mild adjacent fat stranding, outer wall diameter measuring 17 mm. Appendicoliths are present including at the base. No perforation or abscess. The appendix is in typical location extending inferior from the cecum. Distal colonic diverticulosis that is mild. Vascular/Lymphatic: No acute vascular abnormality. No mass or adenopathy. Reproductive:No acute findings. Other: No ascites or pneumoperitoneum. Musculoskeletal: No acute abnormalities. Chronic L5 pars defects with mild L5-S1 anterolisthesis. Generalized lumbar spine degeneration. IMPRESSION: 1. Acute appendicitis without perforation. Appendicoliths are present. 2. Chronic findings are described above. Electronically Signed   By: Tiburcio Pea M.D.   On: 11/16/2022 06:05      Assessment/Plan 57  y/o M who presents with <24h of abdominal pain associated with nausea. Workup shows acute  appendicitis with appendicolith, without evidence of perforation. He also has a small fat containing umbilical hernia on CT.  The operative and non-operative management of appendiciits was discussed with the patient. Risks of surgery including bleeding, infection, damage to surrounding structures, conversion to open, drain placement, need for additional procedures, prolonged hospital stay, as well as the risks of general anesthesia were discussed with the patient and he would like to proceed with surgery. Questions were welcomed and answered.   Plan to discharge from PACU if surgery is uncomplicated.   FEN - NPO, IVF VTE - SCD's ID - Rocephin/Flagyl given in ED Admit - CCS observation   HTN Asthma ASA on CPAP    I reviewed nursing notes, ED provider notes, last 24 h vitals and pain scores, last 48 h intake and output, last 24 h labs and trends, and last 24 h imaging results.  Adam Phenix, [facility] Surgery 11/16/2022, 8:13 AM Please see Amion for pager number during day hours 7:00am-4:30pm or 7:00am -11:30am on weekends

## 2022-11-16 NOTE — Op Note (Signed)
   Operative Note   Date: 11/16/2022  Procedure: laparoscopic appendectomy  Pre-op diagnosis: acute appendicitis Post-op diagnosis: Grade 2 appendicitis: perforated appendix  Indication and clinical history: The patient is a 57 y.o. year old male with appendicitis     Surgeon: Diamantina Monks, MD  Anesthesiologist: Tacy Dura, MD Anesthesia: General  Findings:  Specimen: appendix EBL: <5cc Drains/Implants: none  Disposition: PACU - hemodynamically stable.  Description of procedure: The patient was positioned supine on the operating room table. Time-out was performed verifying correct patient, procedure, signature of informed consent, and administration of pre-operative antibiotics. General anesthetic induction and intubation were uneventful. Foley catheter insertion was not performed as patient voided immediately prior to the procedure . The abdomen was prepped and draped in the usual sterile fashion. An infra-umbilical incision was made using an open technique using zero vicryl stay sutures on either side of the fascia and a 10mm Hassan port inserted. After establishing pneumoperitoneum, which the patient tolerated well, the abdominal cavity was inspected and no injury of any intra-abdominal structures was identified. Two additional five millimeter ports were placed under direct visualization and using local anesthetic in the suprapubic and left lower quadrant regions. The patient was repositioned to Trendelenburg with the left side down. Further inspection of the right lower quadrant revealed purulent fluid and grade 2 appendicitis. The appendix was dissected away from its mesoappendix and an endoscopic stapler used to divide the mesoappendix using a vascular load. A bowel load of the endoscopic stapler was used to staple across the appendix at its base. Both staple lines were inspected and found to be intact and without bleeding. The appendix was placed in an endoscopic specimen retrieval  bag, removed via the umbilical port site, and sent to pathology as a permanent specimen. The right lower quadrant was again inspected and hemostasis confirmed. Any remaining fluid identified was evacuated. The umbilical port was removed last after desufflating the abdomen and the fascia re-approximated using the stay sutures. Additional local anesthetic was administered at the umbilical incision site. The skin of all port sites was closed with 4-0 monocryl. Sterile dressings were applied. All sponge and instrument counts were correct at the conclusion of the procedure. The patient was awakened from anesthesia, extubated uneventfully, and transported to the PACU in good condition. There were no complications.   Upon entering the abdomen (organ space), I encountered infection of the appendix .  CASE DATA:  Type of patient?: DOW CASE (Surgical Hospitalist [facility])  Status of Case? URGENT Add On  Infection Present At Time Of Surgery (PATOS)?  INFECTION of the appendix   Diamantina Monks, MD General and Trauma Surgery [facility] Surgery

## 2022-11-16 NOTE — Anesthesia Preprocedure Evaluation (Addendum)
Anesthesia Evaluation  Patient identified by MRN, date of birth, ID band Patient awake    Reviewed: Allergy & Precautions, H&P , NPO status , Patient's Chart, lab work & pertinent test results  Airway Mallampati: II  TM Distance: >3 FB Neck ROM: Full    Dental no notable dental hx. (+) Teeth Intact, Dental Advisory Given   Pulmonary neg pulmonary ROS, sleep apnea and Continuous Positive Airway Pressure Ventilation , former smoker   Pulmonary exam normal breath sounds clear to auscultation       Cardiovascular Exercise Tolerance: Good hypertension, Pt. on medications negative cardio ROS Normal cardiovascular exam Rhythm:Regular Rate:Normal     Neuro/Psych  PSYCHIATRIC DISORDERS Anxiety Depression    negative neurological ROS  negative psych ROS   GI/Hepatic negative GI ROS, Neg liver ROS,GERD  Medicated,,  Endo/Other  negative endocrine ROS  Morbid obesity  Renal/GU negative Renal ROS  negative genitourinary   Musculoskeletal negative musculoskeletal ROS (+) Arthritis , Osteoarthritis,    Abdominal   Peds negative pediatric ROS (+)  Hematology negative hematology ROS (+)   Anesthesia Other Findings   Reproductive/Obstetrics negative OB ROS                             Anesthesia Physical Anesthesia Plan  ASA: 3 and emergent  Anesthesia Plan: General   Post-op Pain Management: Ofirmev IV (intra-op)*, Toradol IV (intra-op)* and Minimal or no pain anticipated   Induction: Intravenous and Cricoid pressure planned  PONV Risk Score and Plan: 2 and Ondansetron, Dexamethasone and Treatment may vary due to age or medical condition  Airway Management Planned: Oral ETT  Additional Equipment:   Intra-op Plan:   Post-operative Plan: Extubation in OR  Informed Consent: I have reviewed the patients History and Physical, chart, labs and discussed the procedure including the risks, benefits  and alternatives for the proposed anesthesia with the patient or authorized representative who has indicated his/her understanding and acceptance.       Plan Discussed with: Anesthesiologist and CRNA  Anesthesia Plan Comments: (  )        Anesthesia Quick Evaluation

## 2022-11-16 NOTE — ED Provider Notes (Signed)
[facility]PT [facility] Emergency Department Provider Note MRN:  [id number]  Arrival date & time: 11/16/22     Chief Complaint   Abdominal Pain   History of Present Illness   Timothy Murphy is a 57 y.o. year-old male with no pertinent past medical history presenting to the ED with chief complaint of abdominal pain.  Lower abdominal pain starting at about 5:30 PM, progressively worsening, not going away, associated with significant nausea.  No vomiting or diarrhea, normal bowel movements.  Diffuse in the suprapubic region and right and left lower quadrants.  Has also noted that his bellybutton is normally protruding but now it is not.  Review of Systems  A thorough review of systems was obtained and all systems are negative except as noted in the HPI and PMH.   Patient's Health History    Past Medical History:  Diagnosis Date   Anxiety    Asthma    as a child   Depression    Hypertension    OSA (obstructive sleep apnea)    OSA on CPAP    wears cpap about 60% of time   Seasonal allergies     Past Surgical History:  Procedure Laterality Date   ESOPHAGEAL MANOMETRY N/A 12/17/2019   Procedure: ESOPHAGEAL MANOMETRY (EM);  Surgeon: Charlott Rakes, MD;  Location: [facility];  Service: Endoscopy;  Laterality: N/A;   NISSEN FUNDOPLICATION     PH IMPEDANCE STUDY N/A 12/17/2019   Procedure: PH IMPEDANCE STUDY;  Surgeon: Charlott Rakes, MD;  [facility];  Service: Endoscopy;  Laterality: N/A;    Family History  Problem Relation Age of Onset   Allergic rhinitis Mother    Sleep disorder Mother    Sleep disorder Sister    Lung cancer Maternal Uncle    Sleep disorder Maternal Grandmother    Cancer Maternal Grandfather        Blood cancer   Asthma Neg Hx    Eczema Neg Hx    Urticaria Neg Hx    Angioedema Neg Hx     Social History   Socioeconomic History   Marital status: Married    Spouse name: Not on file   Number of children: Not on file    Years of education: Not on file   Highest education level: Not on file  Occupational History   Occupation: Lobbyist: [employer]  Tobacco Use   Smoking status: Former    Current packs/day: 0.00    Average packs/day: 1 pack/day for 24.0 years (24.0 ttl pk-yrs)    Types: Cigarettes    Start date: 80    Quit date: 2008    Years since quitting: 16.8   Smokeless tobacco: Never  Vaping Use   Vaping status: Never Used  Substance and Sexual Activity   Alcohol use: Yes    Comment: once a week seasonal   Drug use: Never   Sexual activity: Yes    Partners: Female  Other Topics Concern   Not on file  Social History Narrative   ** Merged History Encounter **    Left handed   Scientist, product/process development    Social Determinants of Health   Financial Resource Strain: Not on file  Food Insecurity: Not on file  Transportation Needs: Not on file  Physical Activity: Not on file  Stress: Not on file  Social Connections: Not on file  Intimate Partner Violence: Not on file     Physical Exam   Vitals:   11/16/22 0600  11/16/22 0636  BP: (!) 143/79   Pulse: 95   Resp: 18   Temp:  97.9 F (36.6 C)  SpO2: 95%     CONSTITUTIONAL: Well-appearing, NAD NEURO/PSYCH:  Alert and oriented x 3, no focal deficits EYES:  eyes equal and reactive ENT/NECK:  no LAD, no JVD CARDIO: Regular rate, well-perfused, normal S1 and S2 PULM:  CTAB no wheezing or rhonchi GI/GU:  non-distended, non-tender MSK/SPINE:  No gross deformities, no edema SKIN:  no rash, atraumatic   *Additional and/or pertinent findings included in MDM below  Diagnostic and Interventional Summary    EKG Interpretation Date/Time:    Ventricular Rate:    PR Interval:    QRS Duration:    QT Interval:    QTC Calculation:   R Axis:      Text Interpretation:         Labs Reviewed  COMPREHENSIVE METABOLIC PANEL - Abnormal; Notable for the following components:      Result Value   CO2 21 (*)    Glucose, Bld 190 (*)     Creatinine, Ser 1.33 (*)    All other components within normal limits  CBC - Abnormal; Notable for the following components:   WBC 11.2 (*)    All other components within normal limits  LIPASE, BLOOD  URINALYSIS, ROUTINE W REFLEX MICROSCOPIC    CT ABDOMEN PELVIS W CONTRAST  Final Result      Medications  cefTRIAXone (ROCEPHIN) 2 g in sodium chloride 0.9 % 100 mL IVPB (has no administration in time range)  metroNIDAZOLE (FLAGYL) IVPB 500 mg (has no administration in time range)  sodium chloride 0.9 % bolus 1,000 mL (0 mLs Intravenous Stopped 11/16/22 0636)  ondansetron (ZOFRAN) injection 4 mg (4 mg Intravenous Given 11/16/22 0329)  HYDROmorphone (DILAUDID) injection 0.5 mg (0.5 mg Intravenous Given 11/16/22 0329)  iohexol (OMNIPAQUE) 350 MG/ML injection 75 mL (75 mLs Intravenous Contrast Given 11/16/22 0401)     Procedures  /  Critical Care .Critical Care  Performed by: Sabas Sous, MD Authorized by: Sabas Sous, MD   Critical care provider statement:    Critical care time (minutes):  35   Critical care was necessary to treat or prevent imminent or life-threatening deterioration of the following conditions: Acute appendicitis.   Critical care was time spent personally by me on the following activities:  Development of treatment plan with patient or surrogate, discussions with consultants, evaluation of patient's response to treatment, examination of patient, ordering and review of laboratory studies, ordering and review of radiographic studies, ordering and performing treatments and interventions, pulse oximetry, re-evaluation of patient's condition and review of old charts   ED Course and Medical Decision Making  Initial Impression and Ddx Differential diagnosis includes bowel obstruction, diverticulitis, appendicitis, incarcerated hernia.  Awaiting CT.  Past medical/surgical history that increases complexity of ED encounter: None  Interpretation of Diagnostics I  personally reviewed the laboratory assessment and my interpretation is as follows: No significant blood count or electrolyte disturbance  CT reveals acute appendicitis  Patient Reassessment and Ultimate Disposition/Management     Plan is for general surgery admission.  Patient management required discussion with the following services or consulting groups:  General/Trauma Surgery  Complexity of Problems Addressed Acute illness or injury that poses threat of life of bodily function  Additional Data Reviewed and Analyzed Further history obtained from: Further history from spouse/family member  Additional Factors Impacting ED Encounter Risk Use of parenteral controlled substances and Consideration of hospitalization  Elmer Sow. Pilar Plate, MD [facility] Emergency Medicine [facility] [email or web address]  Final Clinical Impressions(s) / ED Diagnoses     ICD-10-CM   1. Acute appendicitis, unspecified acute appendicitis type  K35.80       ED Discharge Orders     None        Discharge Instructions Discussed with and Provided to Patient:   Discharge Instructions   None      Sabas Sous, MD 11/16/22 [phone]

## 2022-11-16 NOTE — Transfer of Care (Signed)
Immediate Anesthesia Transfer of Care Note  Patient: Timothy Murphy  Procedure(s) Performed: APPENDECTOMY LAPAROSCOPIC  Patient Location: PACU  Anesthesia Type:General  Level of Consciousness: awake and sedated  Airway & Oxygen Therapy: Patient Spontanous Breathing and Patient connected to face mask oxygen  Post-op Assessment: Report given to RN and Post -op Vital signs reviewed and stable  Post vital signs: Reviewed and stable  Last Vitals:  Vitals Value Taken Time  BP 142/100 11/16/22 1245  Temp    Pulse 91 11/16/22 1250  Resp 14 11/16/22 1250  SpO2 90 % 11/16/22 1250  Vitals shown include unfiled device data.  Last Pain:  Vitals:   11/16/22 1046  TempSrc: Oral  PainSc: 6          Complications: No notable events documented.

## 2022-11-16 NOTE — Anesthesia Postprocedure Evaluation (Signed)
Anesthesia Post Note  Patient: Timothy Murphy  Procedure(s) Performed: APPENDECTOMY LAPAROSCOPIC     Patient location during evaluation: PACU Anesthesia Type: General Level of consciousness: awake and alert Pain management: pain level controlled Vital Signs Assessment: post-procedure vital signs reviewed and stable Respiratory status: spontaneous breathing, nonlabored ventilation, respiratory function stable and patient connected to nasal cannula oxygen Cardiovascular status: blood pressure returned to baseline and stable Postop Assessment: no apparent nausea or vomiting Anesthetic complications: no   No notable events documented.  Last Vitals:  Vitals:   11/16/22 1315 11/16/22 1330  BP: (!) 146/90 (!) 145/93  Pulse: 100 97  Resp: 14 14  Temp:    SpO2: 93% 93%    Last Pain:  Vitals:   11/16/22 1315  TempSrc:   PainSc: 0-No pain                 Izan Miron

## 2022-11-16 NOTE — Anesthesia Procedure Notes (Signed)
Procedure Name: Intubation Date/Time: 11/16/2022 11:44 AM  Performed by: Debbe Odea, CRNAPre-anesthesia Checklist: Patient identified, Emergency Drugs available, Suction available and Patient being monitored Patient Re-evaluated:Patient Re-evaluated prior to induction Oxygen Delivery Method: Circle System Utilized Preoxygenation: Pre-oxygenation with 100% oxygen Induction Type: IV induction Ventilation: Mask ventilation without difficulty Laryngoscope Size: Miller and 2 Grade View: Grade I Tube type: Oral Tube size: 7.5 mm Number of attempts: 1 Airway Equipment and Method: Stylet Placement Confirmation: ETT inserted through vocal cords under direct vision, positive ETCO2 and breath sounds checked- equal and bilateral Tube secured with: Tape Dental Injury: Teeth and Oropharynx as per pre-operative assessment

## 2022-11-16 NOTE — ED Triage Notes (Signed)
Pt is coming in from abd pain tat started 5 to 6 hours, states his umbilical area usually has a protrusion but has noticed that its now a regularly appearing umbilicus. He states the pain is located bilateral in the lower abd region. Has some nausea and vomiting accompanying the pain in his abd as well. Pain is a 10/10

## 2022-11-16 NOTE — Discharge Instructions (Addendum)
[facility]  LAPAROSCOPIC SURGERY: POST OP INSTRUCTIONS Always review your discharge instruction sheet given to you by the facility where your surgery was performed. IF YOU HAVE DISABILITY OR FAMILY LEAVE FORMS, YOU MUST BRING THEM TO THE OFFICE FOR PROCESSING.   DO NOT GIVE THEM TO YOUR DOCTOR.  PAIN CONTROL  Pain regimen: take over-the-counter tylenol (acetaminophen) 1000mg  every six hours, the prescription ibuprofen (600mg ) every six hours and the robaxin (methocarbamol) 750mg  every six hours. With all three of these, you should be taking something every two hours. Example: tylenol (acetaminophen) at 8am, ibuprofen at 10am, robaxin (methocarbamol) at 12pm, tylenol (acetaminophen) again at 2pm, ibuprofen again at 4pm, robaxin (methocarbamol) at 6pm. You also have a prescription for oxycodone, which should be taken if the tylenol (acetaminophen), ibuprofen, and robaxin (methocarbamol) are not enough to control your pain. You may take the oxycodone as frequently as every four hours as needed, but if you are taking the other medications as above, you should not need the oxycodone this frequently. You have also been given a prescription for colace (docusate) which is a stool softener. Please take this as prescribed because the oxycodone can cause constipation and the colace (docusate) will minimize or prevent constipation. Do not drive while taking or under the influence of the oxycodone as it is a narcotic medication. Use ice packs to help control pain. If you need a refill on your pain medication, please contact your pharmacy.  They will contact our office to request authorization. Prescriptions will not be filled after 5pm or on week-ends.  HOME MEDICATIONS Take your usually prescribed medications unless otherwise directed.  DIET You should follow a light diet the first few days after arrival home.  Be sure to include lots of fluids daily.   CONSTIPATION It is common to  experience some constipation after surgery and if you are taking pain medication.  Increasing fluid intake and taking a stool softener (such as Colace) will usually help or prevent this problem from occurring.  A mild laxative (Miralax, over-the counter) should be taken according to package instructions if there are no bowel movements after 48 hours. If still no bowel movement 24 hours after taking Miralax, you may try magnesium citrate, available over the counter at a local pharmacy.   WOUND/INCISION CARE Most patients will experience some swelling and bruising in the area of the incisions.  Ice packs will help.  Swelling and bruising can take several days to resolve.  May shower beginning 11/17/2022.  Do not peel off or scrub skin glue. May allow warm soapy water to run over incision, then rinse and pat dry.  Do not soak in any water (tubs, hot tubs, pools, lakes, oceans) for one week.   ACTIVITIES You may resume regular (light) daily activities beginning the next day--such as daily self-care, walking, climbing stairs--gradually increasing activities as tolerated.  You may have sexual intercourse when it is comfortable.   No lifting greater than 5 pounds for six weeks.  You may drive when you are no longer taking narcotic pain medication, you can comfortably wear a seatbelt, and you can safely maneuver your car and apply brakes.  FOLLOW-UP You should see your doctor in the office for a follow-up appointment approximately 2-3 weeks after your surgery.  You should have been given your post-op/follow-up appointment when your surgery was scheduled.  If you did not receive a post-op/follow-up appointment, make sure that you call for this appointment within a day or two after  you arrive home to ensure a convenient appointment time.  WHEN TO CALL YOUR DOCTOR: Fever over 101.5 Inability to urinate Continued bleeding from incision. Increased pain, redness, or drainage from the incision. Increasing  abdominal pain  The clinic staff is available to answer your questions during regular business hours.  Please don't hesitate to call and ask to speak to one of the nurses for clinical concerns.  If you have a medical emergency, go to the nearest emergency room or call 911.  A surgeon from [facility] is always on call at the hospital. [address], [address], [place], [place]  [address] ? [address] [address], [place], [place]   [address] [phone] ? [phone] ? FAX [phone] Web site: [email or web address]

## 2022-11-17 ENCOUNTER — Encounter (HOSPITAL_COMMUNITY): Payer: Self-pay | Admitting: Surgery

## 2022-11-17 LAB — SURGICAL PATHOLOGY

## 2022-11-19 ENCOUNTER — Other Ambulatory Visit: Payer: Self-pay

## 2022-11-19 ENCOUNTER — Encounter (HOSPITAL_COMMUNITY): Payer: Self-pay

## 2022-11-19 ENCOUNTER — Telehealth: Payer: Self-pay | Admitting: General Surgery

## 2022-11-19 ENCOUNTER — Emergency Department (HOSPITAL_COMMUNITY): Payer: BC Managed Care – PPO

## 2022-11-19 ENCOUNTER — Emergency Department (HOSPITAL_COMMUNITY)
Admission: EM | Admit: 2022-11-19 | Discharge: 2022-11-19 | Disposition: A | Discharge status: Home / Self Care | Payer: BC Managed Care – PPO | Attending: Emergency Medicine | Admitting: Emergency Medicine

## 2022-11-19 DIAGNOSIS — R1084 Generalized abdominal pain: Secondary | ICD-10-CM | POA: Diagnosis present

## 2022-11-19 DIAGNOSIS — R112 Nausea with vomiting, unspecified: Secondary | ICD-10-CM | POA: Insufficient documentation

## 2022-11-19 LAB — CBC WITH DIFFERENTIAL/PLATELET
Abs Immature Granulocytes: 0.11 10*3/uL — ABNORMAL HIGH (ref 0.00–0.07)
Basophils Absolute: 0.1 10*3/uL (ref 0.0–0.1)
Basophils Relative: 1 %
Eosinophils Absolute: 0.3 10*3/uL (ref 0.0–0.5)
Eosinophils Relative: 2 %
HCT: 44.5 % (ref 39.0–52.0)
Hemoglobin: 14.9 g/dL (ref 13.0–17.0)
Immature Granulocytes: 1 %
Lymphocytes Relative: 14 %
Lymphs Abs: 1.7 10*3/uL (ref 0.7–4.0)
MCH: 28.3 pg (ref 26.0–34.0)
MCHC: 33.5 g/dL (ref 30.0–36.0)
MCV: 84.6 fL (ref 80.0–100.0)
Monocytes Absolute: 0.9 10*3/uL (ref 0.1–1.0)
Monocytes Relative: 7 %
Neutro Abs: 9.1 10*3/uL — ABNORMAL HIGH (ref 1.7–7.7)
Neutrophils Relative %: 75 %
Platelets: 286 10*3/uL (ref 150–400)
RBC: 5.26 MIL/uL (ref 4.22–5.81)
RDW: 13.6 % (ref 11.5–15.5)
WBC: 12.1 10*3/uL — ABNORMAL HIGH (ref 4.0–10.5)
nRBC: 0 % (ref 0.0–0.2)

## 2022-11-19 LAB — COMPREHENSIVE METABOLIC PANEL
ALT: 23 U/L (ref 0–44)
AST: 18 U/L (ref 15–41)
Albumin: 3.6 g/dL (ref 3.5–5.0)
Alkaline Phosphatase: 90 U/L (ref 38–126)
Anion gap: 8 (ref 5–15)
BUN: 14 mg/dL (ref 6–20)
CO2: 23 mmol/L (ref 22–32)
Calcium: 9.1 mg/dL (ref 8.9–10.3)
Chloride: 105 mmol/L (ref 98–111)
Creatinine, Ser: 1.03 mg/dL (ref 0.61–1.24)
GFR, Estimated: >60 mL/min (ref >60)
Glucose, Bld: 119 mg/dL — ABNORMAL HIGH (ref 70–99)
Potassium: 4 mmol/L (ref 3.5–5.1)
Sodium: 136 mmol/L (ref 135–145)
Total Bilirubin: 0.7 mg/dL (ref 0.3–1.2)
Total Protein: 6.9 g/dL (ref 6.5–8.1)

## 2022-11-19 MED ORDER — SODIUM CHLORIDE 0.9 % IV BOLUS
1000.0000 mL | Freq: Once | INTRAVENOUS | Status: AC
  Administered 2022-11-19: 1000 mL via INTRAVENOUS

## 2022-11-19 MED ORDER — IOHEXOL 9 MG/ML PO SOLN
ORAL | Status: AC
Start: 2022-11-19 — End: ?
  Filled 2022-11-19: qty 500

## 2022-11-19 MED ORDER — IOHEXOL 350 MG/ML SOLN
75.0000 mL | Freq: Once | INTRAVENOUS | Status: AC | PRN
  Administered 2022-11-19: 75 mL via INTRAVENOUS

## 2022-11-19 MED ORDER — IOHEXOL 12 MG/ML PO SOLN
500.0000 mL | ORAL | Status: AC
  Administered 2022-11-19: 500 mL via ORAL

## 2022-11-19 MED ORDER — HYDROMORPHONE HCL 1 MG/ML IJ SOLN
1.0000 mg | Freq: Once | INTRAMUSCULAR | Status: AC
  Administered 2022-11-19: 1 mg via INTRAVENOUS
  Filled 2022-11-19: qty 1

## 2022-11-19 MED ORDER — ONDANSETRON HCL 4 MG/2ML IJ SOLN
4.0000 mg | Freq: Once | INTRAMUSCULAR | Status: AC
  Administered 2022-11-19: 4 mg via INTRAVENOUS
  Filled 2022-11-19: qty 2

## 2022-11-19 MED ORDER — ONDANSETRON 4 MG PO TBDP: 4.0000 mg | ORAL_TABLET | Freq: Three times a day (TID) | ORAL | 0 refills | Status: AC | PRN

## 2022-11-19 MED ORDER — ONDANSETRON 4 MG PO TBDP
4.0000 mg | ORAL_TABLET | Freq: Once | ORAL | Status: AC
  Administered 2022-11-19: 4 mg via ORAL
  Filled 2022-11-19: qty 1

## 2022-11-19 NOTE — Discharge Instructions (Signed)
Clear liquid diet for the next 8 hours then progress diet.  Take tylenol for pain,  Zofran for nausea

## 2022-11-19 NOTE — ED Provider Notes (Signed)
[place] EMERGENCY DEPARTMENT AT [facility] Provider Note   CSN: [id number] Arrival date & time: 11/19/22  2536     History  Chief Complaint  Patient presents with   Abdominal Pain    Timothy Murphy is a 57 y.o. male.  Patient complains of 1031 for acute appendicitis.  Patient reports that the pain medicine has caused him to be nauseated and vomit.  Patient reports no relief from the medication.  Patient denies any fever or chills.  Patient reports pain full abdomen.  Patient reports that he has had normal bowel movements he is passing gas.  Patient denies any cough or congestion.  He is not experiencing any weakness.  He admits to decreased fluid intake due to nausea.  The history is provided by the patient. No language interpreter was used.  Abdominal Pain Pain location:  Generalized Pain quality: aching   Pain radiates to:  Does not radiate Pain severity:  Moderate Onset quality:  Sudden Timing:  Constant Progression:  Worsening Chronicity:  New Context: not medication withdrawal and not sick contacts   Relieved by:  Nothing Worsened by:  Nothing Ineffective treatments:  None tried Associated symptoms: nausea   Associated symptoms: no anorexia and no fever   Risk factors: no alcohol abuse        Home Medications Prior to Admission medications   Medication Sig Start Date End Date Taking? Authorizing Provider  acetaminophen (TYLENOL) 500 MG tablet Take 2 tablets (1,000 mg total) by mouth 4 (four) times daily. 11/16/22 11/16/23  Diamantina Monks, MD  busPIRone (BUSPAR) 10 MG tablet Take 20 mg by mouth 3 (three) times daily as needed (anxiety). 08/13/20   [provider]  docusate sodium (COLACE) 100 MG capsule Take 1 capsule (100 mg total) by mouth 2 (two) times daily. 11/16/22 11/16/23  Diamantina Monks, MD  EPINEPHrine (AUVI-Q) 0.3 mg/0.3 mL IJ SOAJ injection Use as directed for severe allergic reaction 12/02/18   Bobbitt, Heywood Iles, MD   fluticasone [facility]) 50 MCG/ACT nasal spray Place 2 sprays into both nostrils daily. Patient taking differently: Place 2 sprays into both nostrils daily as needed for allergies or rhinitis. 03/14/22   Martina Sinner, MD  Fluticasone-Umeclidin-Vilant (TRELEGY ELLIPTA) 100-62.5-25 MCG/INH AEPB Inhale 1 puff into the lungs daily. Patient taking differently: Inhale 1 puff into the lungs daily as needed (shortness of breath). 10/20/19   Martina Sinner, MD  guanFACINE (INTUNIV) 1 MG TB24 ER tablet Take 5 mg by mouth See admin instructions. Take 1mg  tablet with 4mg  tablet for total dose of 5mg  daily. 10/17/22   [provider]  guanFACINE (INTUNIV) 4 MG TB24 ER tablet Take 5 mg by mouth See admin instructions. Take 1mg  tablet with 4mg  tablet for total dose of 5mg  daily.    [provider]  ibuprofen (ADVIL) 600 MG tablet Take 1 tablet (600 mg total) by mouth 4 (four) times daily. 11/16/22   Diamantina Monks, MD  ipratropium (ATROVENT) 0.03 % nasal spray Place 2 sprays into both nostrils every 12 (twelve) hours. Patient taking differently: Place 2 sprays into both nostrils 2 (two) times daily as needed for rhinitis. 03/14/22   Martina Sinner, MD  Melatonin 10 MG CAPS Take 20 mg by mouth at bedtime.    [provider]  methocarbamol (ROBAXIN-750) 750 MG tablet Take 1 tablet (750 mg total) by mouth 4 (four) times daily. 11/16/22   Diamantina Monks, MD  oxyCODONE (ROXICODONE) 5 MG immediate release  tablet Take 1 tablet (5 mg total) by mouth every 4 (four) hours as needed. 11/16/22   Diamantina Monks, MD  prazosin (MINIPRESS) 2 MG capsule Take 2 mg by mouth at bedtime.    [provider]  traZODone (DESYREL) 50 MG tablet Take 100 mg by mouth at bedtime. 10/23/21   [provider]  venlafaxine XR (EFFEXOR-XR) 150 MG 24 hr capsule Take 225 mg by mouth See admin instructions. Take 150mg  capsule and 75mg  capsule for total dose of 225mg  once daily.    [provider]  venlafaxine XR (EFFEXOR-XR) 75 MG 24 hr capsule Take 225 mg by mouth See admin instructions. Take 75mg  capsule with 150mg  capsule for total dose of 225mg  daily.    [provider]      Allergies    Shellfish allergy    Review of Systems   Review of Systems  Constitutional:  Negative for fever.  Gastrointestinal:  Positive for abdominal pain and nausea. Negative for anorexia.  All other systems reviewed and are negative.   Physical Exam Updated Vital Signs BP (!) 158/105   Pulse 80   Temp 98.1 F (36.7 C) (Oral)   Resp 15   Ht 5\' 11"  (1.803 m)   Wt 108.9 kg   SpO2 99%   BMI 33.47 kg/m  Physical Exam Vitals and nursing note reviewed.  Constitutional:      Appearance: He is well-developed.  HENT:     Head: Normocephalic.  Cardiovascular:     Rate and Rhythm: Normal rate and regular rhythm.  Pulmonary:     Effort: Pulmonary effort is normal.  Abdominal:     General: Abdomen is flat. Bowel sounds are normal.     Palpations: Abdomen is soft.     Tenderness: There is generalized abdominal tenderness.  Musculoskeletal:        General: Normal range of motion.     Cervical back: Normal range of motion.  Skin:    General: Skin is warm.  Neurological:     General: No focal deficit present.     Mental Status: He is alert and oriented to person, place, and time.  Psychiatric:        Mood and Affect: Mood normal.     ED Results / Procedures / Treatments   Labs (all labs ordered are listed, but only abnormal results are displayed) Labs Reviewed  CBC WITH DIFFERENTIAL/PLATELET - Abnormal; Notable for the following components:      Result Value   WBC 12.1 (*)    Neutro Abs 9.1 (*)    Abs Immature Granulocytes 0.11 (*)    All other components within normal limits  COMPREHENSIVE METABOLIC PANEL - Abnormal; Notable for the following components:   Glucose, Bld 119 (*)    All other components within normal limits  CBC WITH DIFFERENTIAL/PLATELET     EKG None  Radiology CT ABDOMEN PELVIS W CONTRAST  Result Date: 11/19/2022 CLINICAL DATA:  Increased abdominal pain and vomiting. Postop from appendectomy for appendicitis. EXAM: CT ABDOMEN AND PELVIS WITH CONTRAST TECHNIQUE: Multidetector CT imaging of the abdomen and pelvis was performed using the standard protocol following bolus administration of intravenous contrast. RADIATION DOSE REDUCTION: This exam was performed according to the departmental dose-optimization program which includes automated exposure control, adjustment of the mA and/or kV according to patient size and/or use of iterative reconstruction technique. CONTRAST:  75mL OMNIPAQUE IOHEXOL 350 MG/ML SOLN COMPARISON:  11/16/2022 FINDINGS: Lower Chest: No acute findings. Hepatobiliary: A few  tiny cysts are again seen in the liver dome. No suspicious hepatic masses identified. Gallbladder is unremarkable. No evidence of biliary ductal dilatation. Pancreas:  No mass or inflammatory changes. Spleen: Within normal limits in size and appearance. Adrenals/Urinary Tract: No suspicious masses identified. No evidence of ureteral calculi or hydronephrosis. Stomach/Bowel: Postop changes from appendectomy are seen since prior study. Mild soft tissue stranding is seen in the surgical bed, however, there is no evidence of bowel wall thickening or abscess. Diverticulosis is seen mainly involving the descending and sigmoid colon, however there is no evidence of diverticulitis. Vascular/Lymphatic: No pathologically enlarged lymph nodes. No acute vascular findings. Reproductive:  No mass or other significant abnormality. Other:  None. Musculoskeletal: No suspicious bone lesions identified. 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 IMPRESSION: Expected postop changes recent appendectomy. Tiny amount of postop free air noted. No evidence of abscess, bowel obstruction, or other complication. Colonic diverticulosis, without  radiographic evidence of diverticulitis. Electronically Signed   By: Danae Orleans M.D.   On: 11/19/2022 13:49    Procedures Procedures    Medications Ordered in ED Medications  iohexol (OMNIPAQUE) 12 MG/ML oral solution 500 mL (0 mLs Oral Hold 11/19/22 1050)  sodium chloride 0.9 % bolus 1,000 mL (0 mLs Intravenous Stopped 11/19/22 1038)  ondansetron (ZOFRAN) injection 4 mg (4 mg Intravenous Given 11/19/22 0857)  HYDROmorphone (DILAUDID) injection 1 mg (1 mg Intravenous Given 11/19/22 0857)  ondansetron (ZOFRAN-ODT) disintegrating tablet 4 mg (4 mg Oral Given 11/19/22 1051)  iohexol (OMNIPAQUE) 350 MG/ML injection 75 mL (75 mLs Intravenous Contrast Given 11/19/22 1313)    ED Course/ Medical Decision Making/ A&P                                 Medical Decision Making Patient complains of abdominal pain nausea and vomiting.  Patient had an appendectomy on 1031.  Amount and/or Complexity of Data Reviewed Independent Historian: spouse    Details: Patient is here with his wife who is supportive External Data Reviewed: notes.    Details: General surgery notes reviewed Labs: ordered. Decision-making details documented in ED Course.    Details: Labs ordered reviewed and interpreted patient has a slightly elevated white blood cell count of 12.1. Radiology: ordered and independent interpretation performed. Decision-making details documented in ED Course.    Details: CT scan abdomen pelvis ordered reviewed and interpreted CT scan shows postsurgical changes expected.  No acute abnormality Discussion of management or test interpretation with external provider(s): General surgeon Dr. Doylene Canard consulted.  She advised may discharge with follow up as scheduled   Risk Prescription drug management. Risk Details: Pt will take tylenol at home.  Pt given rx for zofran            Final Clinical Impression(s) / ED Diagnoses Final diagnoses:  Nausea and vomiting, unspecified vomiting type   Generalized abdominal pain    Rx / DC Orders ED Discharge Orders     None      An After Visit Summary was printed and given to the patient.    Elson Areas, PA-C 11/19/22 1441    Cathren Laine, MD 11/20/22 [phone]

## 2022-11-19 NOTE — ED Notes (Signed)
Patient being given oral contrast for CT. Will monitor for emesis.

## 2022-11-19 NOTE — Telephone Encounter (Signed)
Pt called in requesting nausea med. Thinks he may be intolerant to oxycodone - causing n/v. Underwent lap appy; has had multiple episodes of vomiting overnight.  Told pt and wife he needed to come to ED to be evaluated - evaluated with labs, imaging r/o complication from lap appy, ileus from recent perf appendicitis; pt and wife voiced understanding.

## 2022-11-19 NOTE — ED Triage Notes (Signed)
Pt c.o upper abd pain with n/v. Pt had appendectomy 2 days ago. Pt taking oxycodone for pain but thinks it is causing him to vomit. Last Bm this morning.

## 2022-11-19 NOTE — ED Notes (Signed)
Pt ambulated to restroom with steady gait.

## 2022-12-20 ENCOUNTER — Emergency Department (HOSPITAL_COMMUNITY): Payer: BC Managed Care – PPO

## 2022-12-20 ENCOUNTER — Other Ambulatory Visit: Payer: Self-pay

## 2022-12-20 ENCOUNTER — Emergency Department (HOSPITAL_COMMUNITY)
Admission: EM | Admit: 2022-12-20 | Discharge: 2022-12-20 | Disposition: A | Discharge status: Home / Self Care | Payer: BC Managed Care – PPO | Attending: Emergency Medicine | Admitting: Emergency Medicine

## 2022-12-20 ENCOUNTER — Encounter (HOSPITAL_COMMUNITY): Payer: Self-pay | Admitting: Pharmacy Technician

## 2022-12-20 DIAGNOSIS — W182XXA Fall in (into) shower or empty bathtub, initial encounter: Secondary | ICD-10-CM | POA: Insufficient documentation

## 2022-12-20 DIAGNOSIS — S060X0A Concussion without loss of consciousness, initial encounter: Secondary | ICD-10-CM | POA: Insufficient documentation

## 2022-12-20 DIAGNOSIS — S0990XA Unspecified injury of head, initial encounter: Secondary | ICD-10-CM | POA: Diagnosis present

## 2022-12-20 MED ORDER — METOCLOPRAMIDE HCL 10 MG PO TABS
10.0000 mg | ORAL_TABLET | Freq: Once | ORAL | Status: AC
  Administered 2022-12-20: 10 mg via ORAL
  Filled 2022-12-20: qty 1

## 2022-12-20 MED ORDER — METOCLOPRAMIDE HCL 10 MG PO TABS: 10.0000 mg | ORAL_TABLET | Freq: Four times a day (QID) | ORAL | 0 refills | Status: AC | PRN

## 2022-12-20 NOTE — Discharge Instructions (Addendum)
As we discussed, your CT showed no bleeding or fractures. You likely have concussion   You likely will have symptoms for several days, up to a week.   Take tylenol for headaches and reglan for severe headaches   See Dr. Arbutus Leas if you have worsening headaches or confusion or dizziness   Return to ER if you have worse headaches, dizziness.
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
## 2022-12-20 NOTE — ED Provider Notes (Signed)
[place] EMERGENCY DEPARTMENT AT [facility] Provider Note   CSN: [id number] Arrival date & time: 12/20/22  1711     History  Chief Complaint  Patient presents with   Timothy Murphy is a 57 y.o. male here presenting with fall and headaches and forgetfulness.  Patient states that on Saturday, he fell and hit his head while taking a bath.  He states that he had a iron cast bathtub and hit his head really hard.  He states that since then he has been having some confusion.  Today he drove past the exit while he was going to work.  He was brought in by family for further evaluation.  Patient is not any blood thinners.  No history of stroke in the past.  The history is provided by the patient.       Home Medications Prior to Admission medications   Medication Sig Start Date End Date Taking? Authorizing Provider  metoCLOPramide (REGLAN) 10 MG tablet Take 1 tablet (10 mg total) by mouth every 6 (six) hours as needed for nausea (nausea/headache). 12/20/22  Yes Charlynne Pander, MD  acetaminophen (TYLENOL) 500 MG tablet Take 2 tablets (1,000 mg total) by mouth 4 (four) times daily. 11/16/22 11/16/23  Diamantina Monks, MD  busPIRone (BUSPAR) 10 MG tablet Take 20 mg by mouth 3 (three) times daily as needed (anxiety). 08/13/20   [provider]  docusate sodium (COLACE) 100 MG capsule Take 1 capsule (100 mg total) by mouth 2 (two) times daily. 11/16/22 11/16/23  Diamantina Monks, MD  EPINEPHrine (AUVI-Q) 0.3 mg/0.3 mL IJ SOAJ injection Use as directed for severe allergic reaction 12/02/18   Bobbitt, Heywood Iles, MD  fluticasone [facility]) 50 MCG/ACT nasal spray Place 2 sprays into both nostrils daily. Patient taking differently: Place 2 sprays into both nostrils daily as needed for allergies or rhinitis. 03/14/22   Martina Sinner, MD  Fluticasone-Umeclidin-Vilant (TRELEGY ELLIPTA) 100-62.5-25 MCG/INH AEPB Inhale 1 puff into the lungs daily. Patient taking  differently: Inhale 1 puff into the lungs daily as needed (shortness of breath). 10/20/19   Martina Sinner, MD  guanFACINE (INTUNIV) 1 MG TB24 ER tablet Take 5 mg by mouth See admin instructions. Take 1mg  tablet with 4mg  tablet for total dose of 5mg  daily. 10/17/22   [provider]  guanFACINE (INTUNIV) 4 MG TB24 ER tablet Take 5 mg by mouth See admin instructions. Take 1mg  tablet with 4mg  tablet for total dose of 5mg  daily.    [provider]  ibuprofen (ADVIL) 600 MG tablet Take 1 tablet (600 mg total) by mouth 4 (four) times daily. 11/16/22   Diamantina Monks, MD  ipratropium (ATROVENT) 0.03 % nasal spray Place 2 sprays into both nostrils every 12 (twelve) hours. Patient taking differently: Place 2 sprays into both nostrils 2 (two) times daily as needed for rhinitis. 03/14/22   Martina Sinner, MD  Melatonin 10 MG CAPS Take 20 mg by mouth at bedtime.    [provider]  methocarbamol (ROBAXIN-750) 750 MG tablet Take 1 tablet (750 mg total) by mouth 4 (four) times daily. 11/16/22   Diamantina Monks, MD  ondansetron (ZOFRAN-ODT) 4 MG disintegrating tablet Take 1 tablet (4 mg total) by mouth every 8 (eight) hours as needed for nausea or vomiting. 11/19/22   Elson Areas, PA-C  oxyCODONE (ROXICODONE) 5 MG immediate release tablet Take 1 tablet (5 mg total) by mouth every 4 (four) hours as needed.  11/16/22   Diamantina Monks, MD  prazosin (MINIPRESS) 2 MG capsule Take 2 mg by mouth at bedtime.    [provider]  traZODone (DESYREL) 50 MG tablet Take 100 mg by mouth at bedtime. 10/23/21   [provider]  venlafaxine XR (EFFEXOR-XR) 150 MG 24 hr capsule Take 225 mg by mouth See admin instructions. Take 150mg  capsule and 75mg  capsule for total dose of 225mg  once daily.    [provider]  venlafaxine XR (EFFEXOR-XR) 75 MG 24 hr capsule Take 225 mg by mouth See admin instructions. Take 75mg  capsule with 150mg  capsule for total dose of 225mg  daily.     [provider]      Allergies    Shellfish allergy    Review of Systems   Review of Systems  Neurological:  Positive for headaches.  Psychiatric/Behavioral:  Positive for confusion.   All other systems reviewed and are negative.   Physical Exam Updated Vital Signs BP 136/86   Pulse 93   Temp 98.8 F (37.1 C) (Oral)   Resp 18   SpO2 96%  Physical Exam Vitals and nursing note reviewed.  HENT:     Head: Normocephalic.     Comments: Left posterior scalp hematoma    Nose: Nose normal.     Mouth/Throat:     Mouth: Mucous membranes are moist.  Eyes:     Extraocular Movements: Extraocular movements intact.     Pupils: Pupils are equal, round, and reactive to light.  Cardiovascular:     Rate and Rhythm: Normal rate and regular rhythm.     Pulses: Normal pulses.     Heart sounds: Normal heart sounds.  Pulmonary:     Effort: Pulmonary effort is normal.     Breath sounds: Normal breath sounds.  Abdominal:     General: Abdomen is flat.     Palpations: Abdomen is soft.  Musculoskeletal:        General: Normal range of motion.     Cervical back: Normal range of motion and neck supple.  Skin:    General: Skin is warm.     Capillary Refill: Capillary refill takes less than 2 seconds.  Neurological:     General: No focal deficit present.     Mental Status: He is alert and oriented to person, place, and time.  Psychiatric:        Mood and Affect: Mood normal.        Behavior: Behavior normal.     ED Results / Procedures / Treatments   Labs (all labs ordered are listed, but only abnormal results are displayed) Labs Reviewed - No data to display  EKG None  Radiology CT Cervical Spine Wo Contrast  Result Date: 12/20/2022 CLINICAL DATA:  Neck trauma with midline tenderness. EXAM: CT CERVICAL SPINE WITHOUT CONTRAST TECHNIQUE: Multidetector CT imaging of the cervical spine was performed without intravenous contrast. Multiplanar CT image reconstructions were also  generated. RADIATION DOSE REDUCTION: This exam was performed according to the departmental dose-optimization program which includes automated exposure control, adjustment of the mA and/or kV according to patient size and/or use of iterative reconstruction technique. COMPARISON:  None Available. FINDINGS: Alignment: Straightening of the usual cervical lordosis is likely degenerative or positional but could indicate muscle spasm. No anterior subluxations. Normal alignment of the posterior elements. Skull base and vertebrae: Skull base appears intact. No vertebral compression deformities. No focal bone lesion or bone destruction. Soft tissues and spinal canal: No prevertebral soft tissue swelling.  No abnormal paraspinal soft tissue mass or infiltration. Disc levels: Degenerative changes throughout with disc space narrowing and endplate osteophyte formation. Disc osteophyte complex causes anterior effacement of the thecal sac and neural foraminal narrowing at multiple levels. No critical stenosis demonstrated. Upper chest: Lung apices are clear. Other: None. IMPRESSION: 1. Nonspecific straightening of usual cervical lordosis. No acute displaced fractures are identified. 2. Moderate degenerative changes. Electronically Signed   By: Burman Nieves M.D.   On: 12/20/2022 20:38   CT Head Wo Contrast  Result Date: 12/20/2022 CLINICAL DATA:  Head trauma, moderate to severe EXAM: CT HEAD WITHOUT CONTRAST TECHNIQUE: Contiguous axial images were obtained from the base of the skull through the vertex without intravenous contrast. RADIATION DOSE REDUCTION: This exam was performed according to the departmental dose-optimization program which includes automated exposure control, adjustment of the mA and/or kV according to patient size and/or use of iterative reconstruction technique. COMPARISON:  MRI brain 11/10/2021 FINDINGS: Brain: No evidence of acute infarction, hemorrhage, hydrocephalus, extra-axial collection or mass  lesion/mass effect. Vascular: No hyperdense vessel or unexpected calcification. Skull: Normal. Negative for fracture or focal lesion. Sinuses/Orbits: Paranasal sinuses and mastoid air cells are clear. Other: None. IMPRESSION: No acute intracranial abnormalities. Electronically Signed   By: Burman Nieves M.D.   On: 12/20/2022 20:35    Procedures Procedures    Medications Ordered in ED Medications  metoCLOPramide (REGLAN) tablet 10 mg (10 mg Oral Given 12/20/22 2145)    ED Course/ Medical Decision Making/ A&P                                 Medical Decision Making Patrickjames Hacker is a 57 y.o. male here presenting with headache and confusion and forgetfulness after head injury several days ago.  CT head and cervical spine unremarkable.  I suspected postconcussive syndrome.  Will discharge home with Reglan as needed.  Patient had seen neurology in the past they recommend neurology follow-up if he has persistent symptoms   Problems Addressed: Concussion without loss of consciousness, initial encounter: acute illness or injury Injury of head, initial encounter: acute illness or injury  Amount and/or Complexity of Data Reviewed Radiology: ordered and independent interpretation performed. Decision-making details documented in ED Course.  Risk Prescription drug management.    Final Clinical Impression(s) / ED Diagnoses Final diagnoses:  Concussion without loss of consciousness, initial encounter  Injury of head, initial encounter    Rx / DC Orders ED Discharge Orders          Ordered    metoCLOPramide (REGLAN) 10 MG tablet  Every 6 hours PRN        12/20/22 2204              Charlynne Pander, MD 12/20/22 2214

## 2022-12-20 NOTE — ED Provider Triage Note (Signed)
Emergency Medicine Provider Triage Evaluation Note  Timothy Murphy , a 57 y.o. male  was evaluated in triage.  Pt complains of some confusion.  States he fell in the bathtub approximately 2 days ago and hit the back of his head and suffered some whiplash.  States he missed his exit today by 5 miles without realizing it.  He then stopped at a green light.  Review of Systems  Positive: As above Negative: As above  Physical Exam  BP (!) 157/109 (BP Location: Right Arm)   Pulse 97   Temp 98.2 F (36.8 C) (Oral)   Resp 18   SpO2 96%  Gen:   Awake, no distress   Resp:  Normal effort  MSK:   Moves extremities without difficulty  Other:    Medical Decision Making  Medically screening exam initiated at 5:59 PM.  Appropriate orders placed.  Roosvelt Harps was informed that the remainder of the evaluation will be completed by another provider, this initial triage assessment does not replace that evaluation, and the importance of remaining in the ED until their evaluation is complete.  Workup initiated   Michelle Piper, Cordelia Poche 12/20/22 1800

## 2022-12-20 NOTE — ED Notes (Signed)
Pt in bed comfortable, complaining of headaches.

## 2022-12-21 ENCOUNTER — Ambulatory Visit (HOSPITAL_COMMUNITY): Payer: BC Managed Care – PPO

## 2023-01-05 ENCOUNTER — Other Ambulatory Visit: Payer: Self-pay | Admitting: Surgery

## 2023-01-05 DIAGNOSIS — K439 Ventral hernia without obstruction or gangrene: Secondary | ICD-10-CM

## 2023-01-19 ENCOUNTER — Encounter: Payer: Self-pay | Admitting: Surgery

## 2023-01-19 ENCOUNTER — Other Ambulatory Visit: Payer: 59

## 2023-01-19 ENCOUNTER — Ambulatory Visit
Admission: RE | Admit: 2023-01-19 | Discharge: 2023-01-19 | Disposition: A | Discharge status: Home / Self Care | Payer: 59 | Source: Ambulatory Visit | Attending: Surgery | Admitting: Surgery

## 2023-01-19 DIAGNOSIS — K439 Ventral hernia without obstruction or gangrene: Secondary | ICD-10-CM

## 2023-01-19 MED ORDER — IOPAMIDOL (ISOVUE-300) INJECTION 61%
100.0000 mL | Freq: Once | INTRAVENOUS | Status: AC | PRN
Start: 2023-01-19 — End: 2023-01-19
  Administered 2023-01-19: 100 mL via INTRAVENOUS

## 2023-12-05 ENCOUNTER — Ambulatory Visit (INDEPENDENT_AMBULATORY_CARE_PROVIDER_SITE_OTHER)

## 2023-12-05 ENCOUNTER — Encounter: Payer: Self-pay | Admitting: Podiatry

## 2023-12-05 ENCOUNTER — Ambulatory Visit: Admitting: Podiatry

## 2023-12-05 DIAGNOSIS — M722 Plantar fascial fibromatosis: Secondary | ICD-10-CM | POA: Diagnosis not present

## 2023-12-05 DIAGNOSIS — M6740 Ganglion, unspecified site: Secondary | ICD-10-CM

## 2023-12-06 NOTE — Progress Notes (Signed)
 Subjective:   Patient ID: Timothy Murphy, male   DOB: 58 y.o.   MRN: [id number]   HPI Patient states he started to develop pain in the right dorsal ankle lateral and he is concerned if there might be any kind of mass in this area as he started to pay attention.  States it does get sore with shoe gear but not severe and its been recent.  Patient does not smoke likes to be active   Review of Systems  All other systems reviewed and are negative.       Objective:  Physical Exam Vitals and nursing note reviewed.  Constitutional:      Appearance: He is well-developed.  Pulmonary:     Effort: Pulmonary effort is normal.  Musculoskeletal:        General: Normal range of motion.  Skin:    General: Skin is warm.  Neurological:     Mental Status: He is alert.     Neurovascular status found to be intact and muscle strength was found to be adequate range of motion adequate with patient noted to have inflammation in the dorsal lateral ankle.  I did not note a mass I do think when you invert the foot it does have some prominent bone structure but I do not see it is being pathological.  It is moderately tender when pressed and patient has good digital perfusion     Assessment:  Probability that this is an inflammatory condition cannot rule out other pathology but would need to get MRI with contrast     Plan:  H&P explained this to him reviewed x-ray and at this point I went ahead did sterile prep and injected around the complex 3 mg dexamethasone  Kenalog 5 mg Xylocaine  applied sterile dressing and if this does not improve condition we will get MRI with contrast.  I explained all this to him and he will seek be seen back as symptoms indicate  X-rays do not indicate any kind of calcification in the area or any other bony pathology that I can tell
